# Patient Record
Sex: Female | Born: 1935 | Race: White | Hispanic: No | Marital: Single | Smoking: Never smoker
Health system: Southern US, Community
[De-identification: ages and names within clinical notes are randomized; demographics above are authoritative.]

## PROBLEM LIST (undated history)

## (undated) DIAGNOSIS — F0280 Dementia in other diseases classified elsewhere without behavioral disturbance: Secondary | ICD-10-CM

## (undated) DIAGNOSIS — F329 Major depressive disorder, single episode, unspecified: Secondary | ICD-10-CM

## (undated) DIAGNOSIS — G629 Polyneuropathy, unspecified: Secondary | ICD-10-CM

## (undated) DIAGNOSIS — F32A Depression, unspecified: Secondary | ICD-10-CM

## (undated) DIAGNOSIS — A048 Other specified bacterial intestinal infections: Secondary | ICD-10-CM

## (undated) DIAGNOSIS — I739 Peripheral vascular disease, unspecified: Secondary | ICD-10-CM

## (undated) DIAGNOSIS — G309 Alzheimer's disease, unspecified: Secondary | ICD-10-CM

## (undated) DIAGNOSIS — Z950 Presence of cardiac pacemaker: Secondary | ICD-10-CM

## (undated) DIAGNOSIS — I10 Essential (primary) hypertension: Secondary | ICD-10-CM

## (undated) DIAGNOSIS — E8809 Other disorders of plasma-protein metabolism, not elsewhere classified: Secondary | ICD-10-CM

## (undated) DIAGNOSIS — K279 Peptic ulcer, site unspecified, unspecified as acute or chronic, without hemorrhage or perforation: Secondary | ICD-10-CM

## (undated) DIAGNOSIS — M1990 Unspecified osteoarthritis, unspecified site: Secondary | ICD-10-CM

## (undated) HISTORY — DX: Polyneuropathy, unspecified: G62.9

## (undated) HISTORY — PX: PACEMAKER PLACEMENT: SHX43

## (undated) HISTORY — DX: Other disorders of plasma-protein metabolism, not elsewhere classified: E88.09

## (undated) HISTORY — DX: Dementia in other diseases classified elsewhere, unspecified severity, without behavioral disturbance, psychotic disturbance, mood disturbance, and anxiety: F02.80

## (undated) HISTORY — DX: Other specified bacterial intestinal infections: A04.8

## (undated) HISTORY — DX: Peptic ulcer, site unspecified, unspecified as acute or chronic, without hemorrhage or perforation: K27.9

## (undated) HISTORY — DX: Other disorders of phosphorus metabolism: E83.39

## (undated) HISTORY — DX: Essential (primary) hypertension: I10

## (undated) HISTORY — DX: Alzheimer's disease, unspecified: G30.9

## (undated) HISTORY — DX: Unspecified osteoarthritis, unspecified site: M19.90

## (undated) HISTORY — PX: OTHER SURGICAL HISTORY: SHX169

---
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
## 2001-06-02 ENCOUNTER — Encounter: Payer: Self-pay | Admitting: Family Medicine

## 2001-06-02 ENCOUNTER — Ambulatory Visit (HOSPITAL_COMMUNITY): Admission: RE | Admit: 2001-06-02 | Discharge: 2001-06-02 | Payer: Self-pay | Admitting: Family Medicine

## 2002-06-09 ENCOUNTER — Encounter: Payer: Self-pay | Admitting: Family Medicine

## 2002-06-09 ENCOUNTER — Ambulatory Visit (HOSPITAL_COMMUNITY): Admission: RE | Admit: 2002-06-09 | Discharge: 2002-06-09 | Payer: Self-pay | Admitting: Family Medicine

## 2003-06-13 ENCOUNTER — Ambulatory Visit (HOSPITAL_COMMUNITY): Admission: RE | Admit: 2003-06-13 | Discharge: 2003-06-13 | Payer: Self-pay | Admitting: Family Medicine

## 2003-06-13 ENCOUNTER — Encounter: Payer: Self-pay | Admitting: Family Medicine

## 2003-12-22 ENCOUNTER — Ambulatory Visit (HOSPITAL_COMMUNITY): Admission: RE | Admit: 2003-12-22 | Discharge: 2003-12-22 | Payer: Self-pay | Admitting: Family Medicine

## 2004-06-15 ENCOUNTER — Ambulatory Visit (HOSPITAL_COMMUNITY): Admission: RE | Admit: 2004-06-15 | Discharge: 2004-06-15 | Payer: Self-pay | Admitting: Family Medicine

## 2007-09-30 ENCOUNTER — Ambulatory Visit (HOSPITAL_COMMUNITY): Admission: RE | Admit: 2007-09-30 | Discharge: 2007-09-30 | Payer: Self-pay | Admitting: Family Medicine

## 2007-12-17 ENCOUNTER — Inpatient Hospital Stay (HOSPITAL_COMMUNITY): Admission: AD | Admit: 2007-12-17 | Discharge: 2007-12-22 | Payer: Self-pay | Admitting: Cardiology

## 2007-12-17 ENCOUNTER — Ambulatory Visit: Payer: Self-pay | Admitting: Internal Medicine

## 2007-12-17 ENCOUNTER — Encounter: Payer: Self-pay | Admitting: Emergency Medicine

## 2007-12-31 ENCOUNTER — Ambulatory Visit: Payer: Self-pay

## 2008-04-13 ENCOUNTER — Ambulatory Visit: Payer: Self-pay | Admitting: Internal Medicine

## 2008-06-29 ENCOUNTER — Encounter (INDEPENDENT_AMBULATORY_CARE_PROVIDER_SITE_OTHER): Payer: Self-pay | Admitting: *Deleted

## 2008-06-29 LAB — CONVERTED CEMR LAB
ALT: 8 units/L
BUN: 16 mg/dL
CO2: 25 meq/L
Calcium: 9.7 mg/dL
Chloride: 104 meq/L
Creatinine, Ser: 0.82 mg/dL
Glucose, Bld: 128 mg/dL

## 2009-01-11 ENCOUNTER — Ambulatory Visit: Payer: Self-pay | Admitting: Internal Medicine

## 2009-01-12 ENCOUNTER — Encounter: Payer: Self-pay | Admitting: Internal Medicine

## 2009-03-14 ENCOUNTER — Ambulatory Visit: Payer: Self-pay | Admitting: Internal Medicine

## 2009-06-14 ENCOUNTER — Ambulatory Visit: Payer: Self-pay | Admitting: Internal Medicine

## 2009-06-19 ENCOUNTER — Encounter: Payer: Self-pay | Admitting: Internal Medicine

## 2009-08-16 DIAGNOSIS — I2510 Atherosclerotic heart disease of native coronary artery without angina pectoris: Secondary | ICD-10-CM | POA: Insufficient documentation

## 2009-08-16 DIAGNOSIS — M109 Gout, unspecified: Secondary | ICD-10-CM

## 2009-08-16 DIAGNOSIS — D696 Thrombocytopenia, unspecified: Secondary | ICD-10-CM | POA: Insufficient documentation

## 2009-08-16 DIAGNOSIS — I459 Conduction disorder, unspecified: Secondary | ICD-10-CM | POA: Insufficient documentation

## 2009-08-16 DIAGNOSIS — E785 Hyperlipidemia, unspecified: Secondary | ICD-10-CM

## 2009-08-16 DIAGNOSIS — I10 Essential (primary) hypertension: Secondary | ICD-10-CM | POA: Insufficient documentation

## 2009-09-13 ENCOUNTER — Ambulatory Visit: Payer: Self-pay | Admitting: Internal Medicine

## 2009-09-21 ENCOUNTER — Encounter: Payer: Self-pay | Admitting: Internal Medicine

## 2010-01-04 ENCOUNTER — Encounter (INDEPENDENT_AMBULATORY_CARE_PROVIDER_SITE_OTHER): Payer: Self-pay | Admitting: *Deleted

## 2010-01-10 ENCOUNTER — Ambulatory Visit: Payer: Self-pay | Admitting: Internal Medicine

## 2010-01-10 DIAGNOSIS — Z950 Presence of cardiac pacemaker: Secondary | ICD-10-CM | POA: Insufficient documentation

## 2010-04-09 ENCOUNTER — Ambulatory Visit: Payer: Self-pay | Admitting: Internal Medicine

## 2010-04-26 ENCOUNTER — Encounter: Payer: Self-pay | Admitting: Internal Medicine

## 2010-07-12 ENCOUNTER — Ambulatory Visit: Payer: Self-pay | Admitting: Internal Medicine

## 2010-07-17 ENCOUNTER — Encounter: Payer: Self-pay | Admitting: Internal Medicine

## 2010-10-18 ENCOUNTER — Ambulatory Visit: Payer: Self-pay | Admitting: Internal Medicine

## 2010-11-15 ENCOUNTER — Encounter (INDEPENDENT_AMBULATORY_CARE_PROVIDER_SITE_OTHER): Payer: Self-pay | Admitting: *Deleted

## 2011-01-14 ENCOUNTER — Inpatient Hospital Stay (HOSPITAL_COMMUNITY)
Admission: EM | Admit: 2011-01-14 | Discharge: 2011-01-22 | DRG: 327 | Disposition: A | Discharge status: Skilled Nursing Facility | Payer: MEDICARE | Attending: General Surgery | Admitting: General Surgery

## 2011-01-14 DIAGNOSIS — I10 Essential (primary) hypertension: Secondary | ICD-10-CM | POA: Diagnosis present

## 2011-01-14 DIAGNOSIS — E785 Hyperlipidemia, unspecified: Secondary | ICD-10-CM | POA: Diagnosis present

## 2011-01-14 DIAGNOSIS — A048 Other specified bacterial intestinal infections: Secondary | ICD-10-CM | POA: Diagnosis present

## 2011-01-14 DIAGNOSIS — K255 Chronic or unspecified gastric ulcer with perforation: Principal | ICD-10-CM | POA: Diagnosis present

## 2011-01-14 DIAGNOSIS — E46 Unspecified protein-calorie malnutrition: Secondary | ICD-10-CM | POA: Diagnosis not present

## 2011-01-14 DIAGNOSIS — Z950 Presence of cardiac pacemaker: Secondary | ICD-10-CM

## 2011-01-14 DIAGNOSIS — F0390 Unspecified dementia without behavioral disturbance: Secondary | ICD-10-CM | POA: Diagnosis present

## 2011-01-14 DIAGNOSIS — K279 Peptic ulcer, site unspecified, unspecified as acute or chronic, without hemorrhage or perforation: Secondary | ICD-10-CM

## 2011-01-14 DIAGNOSIS — E8809 Other disorders of plasma-protein metabolism, not elsewhere classified: Secondary | ICD-10-CM | POA: Diagnosis not present

## 2011-01-14 HISTORY — DX: Peptic ulcer, site unspecified, unspecified as acute or chronic, without hemorrhage or perforation: K27.9

## 2011-01-14 LAB — URINALYSIS, ROUTINE W REFLEX MICROSCOPIC
Nitrite: NEGATIVE
Specific Gravity, Urine: >1.03 — ABNORMAL HIGH (ref 1.005–1.030)
Urobilinogen, UA: 1 mg/dL (ref 0.0–1.0)

## 2011-01-14 LAB — CBC
MCV: 89.4 fL (ref 78.0–100.0)
Platelets: 198 10*3/uL (ref 150–400)
RBC: 4.45 MIL/uL (ref 3.87–5.11)
WBC: 14.7 10*3/uL — ABNORMAL HIGH (ref 4.0–10.5)

## 2011-01-14 LAB — COMPREHENSIVE METABOLIC PANEL
ALT: 17 U/L (ref 0–35)
AST: 30 U/L (ref 0–37)
Albumin: 3.7 g/dL (ref 3.5–5.2)
CO2: 23 mEq/L (ref 19–32)
Calcium: 9.7 mg/dL (ref 8.4–10.5)
GFR calc non Af Amer: 47 mL/min — ABNORMAL LOW (ref >60)
Sodium: 139 mEq/L (ref 135–145)
Total Protein: 6.8 g/dL (ref 6.0–8.3)

## 2011-01-14 LAB — DIFFERENTIAL
Eosinophils Absolute: 0 10*3/uL (ref 0.0–0.7)
Lymphocytes Relative: 12 % (ref 12–46)
Lymphs Abs: 1.8 10*3/uL (ref 0.7–4.0)
Neutrophils Relative %: 84 % — ABNORMAL HIGH (ref 43–77)

## 2011-01-14 LAB — URINE MICROSCOPIC-ADD ON

## 2011-01-14 LAB — TROPONIN I

## 2011-01-14 LAB — CK TOTAL AND CKMB (NOT AT ARMC)

## 2011-01-15 NOTE — Assessment & Plan Note (Signed)
Summary: 1 yr/fu per checkout on 01/11/09/tg   Visit Type:  Follow-up Primary Provider:  mcinnis   History of Present Illness: [name] returns today for followup.  She is a very pleasant elderly woman with symptomatic bradycardia and pause-dependent polymorphic VT and complete heart block.  She is status post pacemaker insertion today.She returns today for followup.  She denies chest pain.  She notes that her appetite has been slightly decreased, otherwise she had been stable. She has lost almost 10 lbs.  She has been concerned about her cholesterol and has stayed away from high calorie foods.  Current Medications (verified): 1)  Cartia Xt 120 Mg Xr24h-Cap (Diltiazem Hcl Coated Beads) .... Take 1 Tab Daily 2)  Simvastatin 40 Mg Tabs (Simvastatin) .... Take 1 Tab Daily 3)  Lisinopril 10 Mg Tabs (Lisinopril) .... Ake 1 Tab Daily 4)  Metoprolol Tartrate 25 Mg Tabs (Metoprolol Tartrate) .... Take 1 Tab Two Times A Day 5)  Bayer Aspirin 325 Mg Tabs (Aspirin) .... Take 1 Tab Daily  Allergies (verified): No Known Drug Allergies  Past History:  Past Medical History: Last updated: 08/16/2009 Current Problems:  HEART BLOCK (ICD-426.9) CAD (ICD-414.00) THROMBOCYTOPENIA (ICD-287.5) GOUT, UNSPECIFIED (ICD-274.9) HYPERLIPIDEMIA (ICD-272.4) HYPERTENSION (ICD-401.9)  Past Surgical History: Last updated: 08/16/2009 cath 12/17/2007 placement of dual chamber pacemaker a medtronic versa vedro 1 pulse generator in the left subclavian area by Dr.Steven Graciela Husbands.  Review of Systems  The patient denies chest pain, syncope, dyspnea on exertion, and peripheral edema.    Vital Signs:  Patient profile:   77 year old female Weight:      122 pounds Pulse rate:   79 / minute BP sitting:   140 / 75  (right arm)  Vitals Entered By: Dreama Saa, CNA (January 10, 2010 2:54 PM)  Physical Exam  General:  Elderly, well developed, well nourished, in no acute distress.  HEENT: normal Neck: supple.  No JVD. Carotids 2+ bilaterally no bruits Cor: RRR no rubs, gallops or murmur Lungs: CTA. Well healed PPM incision. Ab: soft, nontender. nondistended. No HSM. Good bowel sounds Ext: warm. no cyanosis, clubbing or edema Neuro: alert and oriented. Grossly nonfocal. affect pleasant    PPM Specifications Following MD:  Lewayne Bunting, MD     PPM Vendor:  Medtronic     PPM Model Number:  [id number]     PPM Serial Number:  [id number] PPM DOI:  12/18/2007     PPM Implanting MD:  Lewayne Bunting, MD  Lead 1    Location: [facility]     DOI: 12/18/2007     Model #: 0932     Serial #: [id number]     Status: active Lead 2    Location: [facility]     DOI: 12/18/2007     Model #: 4270     Serial #: [id number]     Status: active  Magnet Response Rate:  BOL 85 ERI 65  Indications:  SECOND DEGREE HEART BLOCK    PPM Follow Up Remote Check?  No Battery Voltage:  2.78 V     Battery Est. Longevity:  6 years     Pacer Dependent:  Yes       PPM Device Measurements Atrium  Amplitude: 1.0 mV, Impedance: 427 ohms, Threshold: 0.5 V at 0.4 msec Right Ventricle  Impedance: 518 ohms, Threshold: 0.625 V at 0.4 msec  Episodes MS Episodes:  12     Percent Mode Switch:  <0.1%     Coumadin:  No Ventricular High Rate:  0     Atrial Pacing:  93.3%     Ventricular Pacing:  100%DDDR  Parameters Mode:  70     Lower Rate Limit:  110     Upper Rate Limit:  150 Paced AV Delay:  120     Next Remote Date:  04/11/2010     Next Cardiology Appt Due:  12/16/2010 Tech Comments:  No parameter changes.  Device function normal.  [facility] transmissions every 3 months.  ROV 1 year Dr. Ladona Ridgel in [place]. Altha Harm, LPN  January 10, 2010 3:20 PM  MD Comments:  Agree with above.  Impression & Recommendations:  Problem # 1:  HEART BLOCK (ICD-426.9) S/P PPM.  No symptoms. Her updated medication list for this problem includes:    Cartia Xt 120 Mg Xr24h-cap (Diltiazem hcl coated beads) .Marland Kitchen... Take 1 tab daily    Lisinopril 10 Mg Tabs (Lisinopril) .Marland Kitchen... Ake  1 tab daily    Metoprolol Tartrate 25 Mg Tabs (Metoprolol tartrate) .Marland Kitchen... Take 1 tab two times a day    Bayer Aspirin 325 Mg Tabs (Aspirin) .Marland Kitchen... Take 1 tab daily  Problem # 2:  HYPERTENSION (ICD-401.9) Her blood pressure has been well controlled.  Will continue a low sodium diet. Her updated medication list for this problem includes:    Cartia Xt 120 Mg Xr24h-cap (Diltiazem hcl coated beads) .Marland Kitchen... Take 1 tab daily    Lisinopril 10 Mg Tabs (Lisinopril) .Marland Kitchen... Ake 1 tab daily    Metoprolol Tartrate 25 Mg Tabs (Metoprolol tartrate) .Marland Kitchen... Take 1 tab two times a day    Bayer Aspirin 325 Mg Tabs (Aspirin) .Marland Kitchen... Take 1 tab daily  Problem # 3:  CARDIAC PACEMAKER IN SITU (ICD-V45.01) Her device is working normally.Will recheck in several months.  Problem # 4:  HYPERLIPIDEMIA (ICD-272.4) In light of her recent weight loss, I have encouraged her to increase her caloric intake even if she requires higher calorie foods. Her updated medication list for this problem includes:    Simvastatin 40 Mg Tabs (Simvastatin) .Marland Kitchen... Take 1 tab daily

## 2011-01-15 NOTE — Letter (Signed)
Summary: Remote Device Check  [facility], Main Office  [address]   [place], [place] [address]   Phone: [phone]  Fax: [phone]     July 17, 2010 MRN: [id number]   [name] [address] [place], [place]  [address]   Dear Ms. Hendrie,   Your remote transmission was recieved and reviewed by your physician.  All diagnostics were within normal limits for you.  __X___Your next transmission is scheduled for:  10-18-2010.  Please transmit at any time this day.  If you have a wireless device your transmission will be sent automatically.    Sincerely,  Vella Kohler

## 2011-01-15 NOTE — Letter (Signed)
Summary: Remote Device Check  [facility], Main Office  [address]   [place], [place] [address]   Phone: [phone]  Fax: [phone]     November 15, 2010 MRN: [id number]   FRANCOISE CHOJNOWSKI [address] [place], [place]  [address]   Dear Ms. Brocksmith,   Your remote transmission was recieved and reviewed by your physician.  All diagnostics were within normal limits for you.  _____Your next transmission is scheduled for:                       .  Please transmit at any time this day.  If you have a wireless device your transmission will be sent automatically.  ___X___Your next office visit is scheduled for: January 2012 with Dr. Ladona Ridgel.                             . Please call our office to schedule an appointment.    Sincerely,  Altha Harm, LPN

## 2011-01-15 NOTE — Op Note (Signed)
  [name], [name]              ACCOUNT NO.:  [id number]  MEDICAL RECORD NO.:  [id number]          PATIENT TYPE:  INP  LOCATION:  A213                          FACILITY:  [facility]  PHYSICIAN:  Dalia Heading, M.D.  DATE OF BIRTH:  Nov 17, 1921  DATE OF PROCEDURE:  01/14/2011 DATE OF DISCHARGE:  01/14/2011                              OPERATIVE REPORT   PREOPERATIVE DIAGNOSIS:  Perforated viscus.  POSTOPERATIVE DIAGNOSES:  Perforated viscus, perforated peptic ulcer  PROCEDURE:  Exploratory laparotomy and gastrorrhaphy.  SURGEON:  Dalia Heading, M.D.  ANESTHESIA:  General endotracheal.  INDICATIONS:  The patient is an 77 year old white female who presented to the emergency room with a several-day history of worsening upper abdominal pain.  Chest x-ray with acute abdominal series was performed which revealed pneumoperitoneum.  The patient now comes to the operating room for an exploratory laparotomy for presumed perforated viscus.  Therisks and benefits of the procedure including bleeding, infection, and the possibility of colostomy were fully explained to the patient, gave informed consent.  PROCEDURE NOTE:  The patient was placed in the supine position.  After induction of general endotracheal anesthesia, the abdomen was prepped and draped in the usual sterile technique with DuraPrep.  Surgical site confirmation was performed.  Upper midline incision was made.  The peritoneal cavity was entered into without difficulty.  On inspection, minimal fluid was noted in the upper abdomen.  There was a large perforation anteriorly at the pylorus involving primarily the duodenum.  The gallbladder and surrounding adipose tissue was noted to be adherent to this region, and this was freed away without difficulty.  The defect was approximately 1.5 to 2 cm in its greatest diameter.  It was reapproximated loosely using 3-0 silk interrupted sutures.  The falciform ligament which was very  floppy and close to this area was used for the plication.  It was secured over the perforation site using 3-0 silk sutures.  #10 flat Jackson-Pratt drain was placed into this region and brought through a separate stab wound inferior to the incision line.  It was secured to the skin level using a 3-0 nylon interrupted suture.  The upper abdomen was then copiously irrigated with normal saline.  The NG tube was noted be in appropriate position in the stomach.  The fascia was reapproximated using an 0 PDS running suture.  The subcutaneous layer was irrigated with normal saline, and the skin was closed using staples.  Betadine ointment dry sterile dressings were applied.  All tape and needle counts were correct at the end of the procedure. The patient was extubated in the operating room, went back to recovery room awake in stable condition.  COMPLICATIONS:  None.  SPECIMEN:  None.  BLOOD LOSS:  Minimal.     Dalia Heading, M.D.     MAJ/MEDQ  D:  01/15/2011  T:  01/15/2011  Job:  [id number]  cc:   Catalina Pizza, M.D. Fax: [phone]  Electronically Signed by Franky Macho M.D. on 01/15/2011 09:16:56 AM

## 2011-01-15 NOTE — Letter (Signed)
Summary: Remote Device Check  [facility], Main Office  [address]   [place], [place] [address]   Phone: [phone]  Fax: [phone]     Apr 26, 2010 MRN: [id number]   SHACARRA CHOE [address] [place], [place]  [address]   Dear Ms. Johanning,   Your remote transmission was recieved and reviewed by your physician.  All diagnostics were within normal limits for you.  __X___Your next transmission is scheduled for:    07-12-2010.  Please transmit at any time this day.  If you have a wireless device your transmission will be sent automatically.   Sincerely,  Vella Kohler

## 2011-01-15 NOTE — Cardiovascular Report (Signed)
Summary: Office Visit Remote   Office Visit Remote   Imported By: Roderic Ovens 07/19/2010 16:06:08  _____________________________________________________________________  External Attachment:    Type:   Image     Comment:   External Document

## 2011-01-15 NOTE — Cardiovascular Report (Signed)
Summary: Office Visit Remote   Office Visit Remote   Imported By: Roderic Ovens 04/30/2010 15:30:56  _____________________________________________________________________  External Attachment:    Type:   Image     Comment:   External Document

## 2011-01-15 NOTE — Miscellaneous (Signed)
Summary: LABS CMP,LIVER 06/29/2008  Clinical Lists Changes  Observations: Added new observation of CALCIUM: 9.7 mg/dL (04/54/0981 19:14) Added new observation of ALBUMIN: 3.8 g/dL (78/29/5621 30:86) Added new observation of PROTEIN, TOT: 6.5 g/dL (57/84/6962 95:28) Added new observation of SGPT (ALT): 8 units/L (06/29/2008 16:11) Added new observation of SGOT (AST): 18 units/L (06/29/2008 16:11) Added new observation of ALK PHOS: 43 units/L (06/29/2008 16:11) Added new observation of BILI DIRECT: 0.2 mg/dL (41/32/4401 02:72) Added new observation of CREATININE: 0.82 mg/dL (53/66/4403 47:42) Added new observation of BUN: 16 mg/dL (59/56/3875 64:33) Added new observation of BG RANDOM: 128 mg/dL (29/51/8841 66:06) Added new observation of CO2 PLSM/SER: 25 meq/L (06/29/2008 16:11) Added new observation of CL SERUM: 104 meq/L (06/29/2008 16:11) Added new observation of K SERUM: 4.1 meq/L (06/29/2008 16:11) Added new observation of NA: 142 meq/L (06/29/2008 16:11) Added new observation of LDL: 157 mg/dL (30/16/0109 32:35) Added new observation of HDL: 58 mg/dL (57/32/2025 42:70) Added new observation of TRIGLYC TOT: 170 mg/dL (62/37/6283 15:17) Added new observation of CHOLESTEROL: 249 mg/dL (61/60/7371 06:26)

## 2011-01-16 LAB — DIFFERENTIAL
Basophils Absolute: 0 10*3/uL (ref 0.0–0.1)
Basophils Relative: 0 % (ref 0–1)
Eosinophils Relative: 0 % (ref 0–5)
Lymphocytes Relative: 5 % — ABNORMAL LOW (ref 12–46)
Monocytes Absolute: 0.7 10*3/uL (ref 0.1–1.0)
Monocytes Relative: 5 % (ref 3–12)

## 2011-01-16 LAB — BASIC METABOLIC PANEL
BUN: 26 mg/dL — ABNORMAL HIGH (ref 6–23)
Calcium: 8.4 mg/dL (ref 8.4–10.5)
Creatinine, Ser: 0.87 mg/dL (ref 0.4–1.2)

## 2011-01-16 LAB — CBC
HCT: 37.9 % (ref 36.0–46.0)
MCH: 30.7 pg (ref 26.0–34.0)
MCHC: 34 g/dL (ref 30.0–36.0)
RDW: 13.8 % (ref 11.5–15.5)

## 2011-01-16 LAB — MAGNESIUM: Magnesium: 1.8 mg/dL (ref 1.5–2.5)

## 2011-01-16 LAB — ALBUMIN: Albumin: 2.5 g/dL — ABNORMAL LOW (ref 3.5–5.2)

## 2011-01-17 LAB — DIFFERENTIAL
Basophils Relative: 0 % (ref 0–1)
Eosinophils Absolute: 0 10*3/uL (ref 0.0–0.7)
Eosinophils Relative: 0 % (ref 0–5)
Lymphs Abs: 0.8 10*3/uL (ref 0.7–4.0)
Monocytes Relative: 5 % (ref 3–12)
Neutrophils Relative %: 87 % — ABNORMAL HIGH (ref 43–77)

## 2011-01-17 LAB — CBC
MCH: 31 pg (ref 26.0–34.0)
MCHC: 34.9 g/dL (ref 30.0–36.0)
MCV: 88.6 fL (ref 78.0–100.0)
Platelets: 173 10*3/uL (ref 150–400)
RBC: 3.78 MIL/uL — ABNORMAL LOW (ref 3.87–5.11)
RDW: 13.9 % (ref 11.5–15.5)

## 2011-01-17 LAB — ALBUMIN: Albumin: 1.9 g/dL — ABNORMAL LOW (ref 3.5–5.2)

## 2011-01-17 LAB — BASIC METABOLIC PANEL
BUN: 21 mg/dL (ref 6–23)
Calcium: 8 mg/dL — ABNORMAL LOW (ref 8.4–10.5)
Chloride: 106 mEq/L (ref 96–112)
Creatinine, Ser: 0.8 mg/dL (ref 0.4–1.2)
GFR calc non Af Amer: >60 mL/min (ref >60)

## 2011-01-17 LAB — MAGNESIUM: Magnesium: 1.7 mg/dL (ref 1.5–2.5)

## 2011-01-18 LAB — DIFFERENTIAL
Basophils Absolute: 0 10*3/uL (ref 0.0–0.1)
Basophils Relative: 0 % (ref 0–1)
Eosinophils Absolute: 0 10*3/uL (ref 0.0–0.7)
Eosinophils Relative: 0 % (ref 0–5)
Monocytes Absolute: 0.7 10*3/uL (ref 0.1–1.0)
Monocytes Relative: 6 % (ref 3–12)
Neutro Abs: 10 10*3/uL — ABNORMAL HIGH (ref 1.7–7.7)

## 2011-01-18 LAB — PHOSPHORUS: Phosphorus: 1 mg/dL — CL (ref 2.3–4.6)

## 2011-01-18 LAB — BASIC METABOLIC PANEL
CO2: 25 mEq/L (ref 19–32)
Calcium: 7.9 mg/dL — ABNORMAL LOW (ref 8.4–10.5)
Creatinine, Ser: 0.69 mg/dL (ref 0.4–1.2)
GFR calc Af Amer: >60 mL/min (ref >60)
GFR calc non Af Amer: >60 mL/min (ref >60)
Sodium: 134 mEq/L — ABNORMAL LOW (ref 135–145)

## 2011-01-18 LAB — CBC
HCT: 32.2 % — ABNORMAL LOW (ref 36.0–46.0)
MCHC: 34.8 g/dL (ref 30.0–36.0)
Platelets: 167 10*3/uL (ref 150–400)
RDW: 13.7 % (ref 11.5–15.5)
WBC: 12.2 10*3/uL — ABNORMAL HIGH (ref 4.0–10.5)

## 2011-01-19 LAB — BASIC METABOLIC PANEL
BUN: 9 mg/dL (ref 6–23)
Calcium: 8.2 mg/dL — ABNORMAL LOW (ref 8.4–10.5)
Creatinine, Ser: 0.71 mg/dL (ref 0.4–1.2)
GFR calc non Af Amer: >60 mL/min (ref >60)
Glucose, Bld: 142 mg/dL — ABNORMAL HIGH (ref 70–99)

## 2011-01-19 LAB — CBC
MCH: 30.7 pg (ref 26.0–34.0)
MCV: 88.4 fL (ref 78.0–100.0)
Platelets: 171 10*3/uL (ref 150–400)
RDW: 13.8 % (ref 11.5–15.5)
WBC: 12.6 10*3/uL — ABNORMAL HIGH (ref 4.0–10.5)

## 2011-01-19 LAB — DIFFERENTIAL
Basophils Relative: 0 % (ref 0–1)
Eosinophils Absolute: 0.1 10*3/uL (ref 0.0–0.7)
Eosinophils Relative: 1 % (ref 0–5)
Lymphs Abs: 1.1 10*3/uL (ref 0.7–4.0)
Monocytes Relative: 10 % (ref 3–12)
Neutrophils Relative %: 80 % — ABNORMAL HIGH (ref 43–77)

## 2011-01-19 LAB — ALBUMIN: Albumin: 2 g/dL — ABNORMAL LOW (ref 3.5–5.2)

## 2011-01-19 LAB — MAGNESIUM: Magnesium: 1.5 mg/dL (ref 1.5–2.5)

## 2011-01-20 LAB — CBC
Hemoglobin: 11 g/dL — ABNORMAL LOW (ref 12.0–15.0)
MCH: 30.6 pg (ref 26.0–34.0)
MCHC: 35.1 g/dL (ref 30.0–36.0)
Platelets: 173 10*3/uL (ref 150–400)
RDW: 13.7 % (ref 11.5–15.5)

## 2011-01-20 LAB — BASIC METABOLIC PANEL
BUN: 6 mg/dL (ref 6–23)
Calcium: 8.2 mg/dL — ABNORMAL LOW (ref 8.4–10.5)
Chloride: 97 mEq/L (ref 96–112)
Creatinine, Ser: 0.6 mg/dL (ref 0.4–1.2)

## 2011-01-20 LAB — DIFFERENTIAL
Basophils Relative: 0 % (ref 0–1)
Eosinophils Absolute: 0.1 10*3/uL (ref 0.0–0.7)
Eosinophils Relative: 0 % (ref 0–5)
Monocytes Absolute: 1.7 10*3/uL — ABNORMAL HIGH (ref 0.1–1.0)
Monocytes Relative: 11 % (ref 3–12)

## 2011-01-20 LAB — ALBUMIN: Albumin: 2 g/dL — ABNORMAL LOW (ref 3.5–5.2)

## 2011-01-20 LAB — MAGNESIUM: Magnesium: 1.4 mg/dL — ABNORMAL LOW (ref 1.5–2.5)

## 2011-01-21 LAB — CBC
HCT: 30.6 % — ABNORMAL LOW (ref 36.0–46.0)
MCHC: 34.6 g/dL (ref 30.0–36.0)
MCV: 86.9 fL (ref 78.0–100.0)
RDW: 13.7 % (ref 11.5–15.5)

## 2011-01-21 LAB — DIFFERENTIAL
Basophils Absolute: 0 10*3/uL (ref 0.0–0.1)
Eosinophils Relative: 0 % (ref 0–5)
Lymphocytes Relative: 8 % — ABNORMAL LOW (ref 12–46)
Monocytes Absolute: 1.7 10*3/uL — ABNORMAL HIGH (ref 0.1–1.0)

## 2011-01-21 LAB — BASIC METABOLIC PANEL
CO2: 29 mEq/L (ref 19–32)
Calcium: 8.2 mg/dL — ABNORMAL LOW (ref 8.4–10.5)
Creatinine, Ser: 0.64 mg/dL (ref 0.4–1.2)
GFR calc Af Amer: >60 mL/min (ref >60)
Glucose, Bld: 118 mg/dL — ABNORMAL HIGH (ref 70–99)

## 2011-01-22 HISTORY — PX: INSERT / REPLACE / REMOVE PACEMAKER: SUR710

## 2011-01-25 NOTE — Discharge Summary (Signed)
  NAME:  [name], [name] NO.:  [id number]  MEDICAL RECORD NO.:  [id number]           PATIENT TYPE:  I  LOCATION:                                 FACILITY:  PHYSICIAN:  Dalia Heading, M.D.  DATE OF BIRTH:  February 17, 1921  DATE OF ADMISSION:  01/14/2011 DATE OF DISCHARGE:  02/06/2012LH                              DISCHARGE SUMMARY   HOSPITAL COURSE SUMMARY:  The patient is an 77 year old white female who presented to the emergency room with worsening abdominal pain.  An abdominal film and chest x-ray were performed which revealed pneumoperitoneum.  Surgery consultation was obtained and the patient was taken urgently to the operating room and underwent exploratory laparotomy with a gastrorrhaphy.  She had a pyloric perforation.  She tolerated the procedure remarkably well.  The postoperative course was remarkable for electrolyte abnormalities including a low phosphorus level, hypoalbuminemia.  Her urine output was low initially and had to be supplemented with boluses of normal saline.  She did test positive for H. pylori.  Once her urine output improved, her Foley was removed and her diet was advanced without significant difficulty.  She does require a significant amount of encouragement and nursing help to eat. This is secondary to her age.  She was obstipated and this was resolved with disimpaction and enemas.  She is being discharged to [facility] in fair and stable condition.  DISCHARGE INSTRUCTIONS:  The patient is to have her abdominal staples removed on January 28, 2011.  DISCHARGE MEDICATIONS: 1. Amoxicillin 1 gram p.o. b.i.d. x10 days. 2. Biaxin 500 mg p.o. b.i.d. x10 days. 3. Prevacid 30 mg p.o. b.i.d. 4. Diltiazem 120 mg p.o. daily. 5. Celexa 10 mg p.o. daily. 6. Diclofenac 50 mg p.o. b.i.d. 7. Lopressor 25 mg p.o. b.i.d. 8. Namenda 10 mg p.o. b.i.d. 9. Tramadol 25 mg p.o. q.6 h. p.r.n. 10.Zocor 40 mg p.o. at bedtime.  PRINCIPAL  DIAGNOSES: 1. Peptic ulcer. 2. Helicobacter pylori positive. 3. Hypertension. 4. Status post pacemaker placement in the past. 5. Hypophosphatemia, resolved. 6. Hypoalbuminemia.  PRINCIPAL PROCEDURE:  Exploratory laparotomy, gastrorrhaphy on January 14, 2011.     Dalia Heading, M.D.     MAJ/MEDQ  D:  01/21/2011  T:  01/21/2011  Job:  [id number]  cc:   Catalina Pizza, M.D. Fax: [phone]  Electronically Signed by Franky Macho M.D. on 01/25/2011 01:24:35 PM

## 2011-02-06 ENCOUNTER — Encounter: Payer: Self-pay | Admitting: Internal Medicine

## 2011-02-06 ENCOUNTER — Encounter (INDEPENDENT_AMBULATORY_CARE_PROVIDER_SITE_OTHER): Payer: MEDICARE | Admitting: Internal Medicine

## 2011-02-06 DIAGNOSIS — R609 Edema, unspecified: Secondary | ICD-10-CM | POA: Insufficient documentation

## 2011-02-06 DIAGNOSIS — I442 Atrioventricular block, complete: Secondary | ICD-10-CM

## 2011-02-06 DIAGNOSIS — I4891 Unspecified atrial fibrillation: Secondary | ICD-10-CM

## 2011-02-06 DIAGNOSIS — I5033 Acute on chronic diastolic (congestive) heart failure: Secondary | ICD-10-CM

## 2011-02-07 ENCOUNTER — Encounter: Payer: Self-pay | Admitting: Internal Medicine

## 2011-02-11 ENCOUNTER — Encounter: Payer: Self-pay | Admitting: Internal Medicine

## 2011-02-12 NOTE — Assessment & Plan Note (Signed)
Summary: 1 yr f/u per checkout on 01/10/10/tg/hm   Visit Type:  Follow-up Primary Provider:  Dr.[facility]  CC:  edema in feet and legs.  History of Present Illness: [name] returns today for followup.  She is a very pleasant elderly woman with symptomatic bradycardia and pause-dependent polymorphic VT and complete heart block.  She is status post pacemaker insertion. She returns today for followup.  She denies chest pain.  She has been in the hospital with a perforated ulcer requiring emergent surgery. She has recovered except that her memory is bad and her legs have been swollen. No other complaints. She had initially been given 20 mg a day of lasix but her daughter does not think that this has helped much.  Current Medications (verified): 1)  Simvastatin 10 Mg Tabs (Simvastatin) .... Take 1 Tab Dialy 2)  Metoprolol Tartrate 25 Mg Tabs (Metoprolol Tartrate) .... Take 1 Tab Two Times A Day 3)  Bayer Aspirin 325 Mg Tabs (Aspirin) .... Take 1 Tab Daily 4)  Diclofenac Sodium 50 Mg Tbec (Diclofenac Sodium) .... Take 1 Tab Two Times A Day 5)  Prevacid 30 Mg Cpdr (Lansoprazole) .... Take 1 Tab Two Times A Day 6)  Biaxin 500 Mg Tabs (Clarithromycin) .... Take 1 Tab Two Times A Day 7)  Tramadol Hcl 50 Mg Tabs (Tramadol Hcl) .... 1/2 Tab As Needed 8)  Namenda 10 Mg Tabs (Memantine Hcl) .... Take 1 Tab Two Times A Day 9)  Furosemide 40 Mg Tabs (Furosemide) .... Take One Tablet By Mouth Daily. 10)  Potassium Chloride 20 Meq Pack (Potassium Chloride) .... Take 2 Tablets By Mouth Daily 11)  Senokot 8.6 Mg Tabs (Sennosides) .... Take 2 Tabs As Needed  Allergies (verified): No Known Drug Allergies  Comments:  Nurse/Medical Assistant: patient is a resident of [facility]  Past History:  Past Medical History: Last updated: 08/16/2009 Current Problems:  HEART BLOCK (ICD-426.9) CAD (ICD-414.00) THROMBOCYTOPENIA (ICD-287.5) GOUT, UNSPECIFIED (ICD-274.9) HYPERLIPIDEMIA (ICD-272.4) HYPERTENSION  (ICD-401.9)  Past Surgical History: Last updated: 08/16/2009 cath 12/17/2007 placement of dual chamber pacemaker a medtronic versa vedro 1 pulse generator in the left subclavian area by Dr.Steven Graciela Husbands.  Review of Systems       The patient complains of peripheral edema.  The patient denies chest pain, syncope, and dyspnea on exertion.    Vital Signs:  Patient profile:   77 year old female Height:      65 inches Weight:      128 pounds BMI:     21.38 Pulse rate:   74 / minute BP sitting:   97 / 58  (left arm)  Vitals Entered By: Dreama Saa, CNA (February 06, 2011 9:38 AM)  Physical Exam  General:  Elderly, well developed, well nourished, in no acute distress.  HEENT: normal Neck: supple. No JVD. Carotids 2+ bilaterally no bruits Cor: RRR no rubs, gallops or murmur Lungs: CTA. Well healed PPM incision. Ab: soft, nontender. with minimal distention. No HSM. Good bowel sounds Ext: warm. no cyanosis, clubbing. 3+ edema. Neuro: alert and oriented. Grossly nonfocal. affect pleasant    PPM Specifications Following MD:  Lewayne Bunting, MD     PPM Vendor:  [employer]     PPM Model Number:  [id number]     PPM Serial Number:  [id number] PPM DOI:  12/18/2007     PPM Implanting MD:  Lewayne Bunting, MD  Lead 1    Location: [facility]     DOI: 12/18/2007     Model #: 8469  Serial #: [id number]     Status: active Lead 2    Location: [facility]     DOI: 12/18/2007     Model #: 1478     Serial #: [id number]     Status: active  Magnet Response Rate:  BOL 85 ERI 65  Indications:  SECOND DEGREE HEART BLOCK    PPM Follow Up Pacer Dependent:  Yes      Episodes Coumadin:  No  Parameters Mode:  70     Lower Rate Limit:  110     Upper Rate Limit:  150 Paced AV Delay:  120     MD Comments:  Normal device function.  Impression & Recommendations:  Problem # 1:  CARDIAC PACEMAKER IN SITU (ICD-V45.01) Her device is working normally. Will recheck in several months.  Problem # 2:  HYPERTENSION  (ICD-401.9) Her blood pressure is low today and I have asked her to stop her cartia. The following medications were removed from the medication list:    Cartia Xt 120 Mg Xr24h-cap (Diltiazem hcl coated beads) .Marland Kitchen... Take 1 tab daily    Lisinopril 10 Mg Tabs (Lisinopril) .Marland Kitchen... Ake 1 tab daily Her updated medication list for this problem includes:    Metoprolol Tartrate 25 Mg Tabs (Metoprolol tartrate) .Marland Kitchen... Take 1 tab two times a day    Bayer Aspirin 325 Mg Tabs (Aspirin) .Marland Kitchen... Take 1 tab daily    Furosemide 40 Mg Tabs (Furosemide) .Marland Kitchen... Take one tablet by mouth daily.  Problem # 3:  EDEMA (ICD-782.3) I have asked her to maintain a low sodium diet, increase her lasix and potassium and will check labs today.  Other Orders: T-Basic Metabolic Panel [phone])  Patient Instructions: 1)  Your physician recommends that you schedule a follow-up appointment in: 1 YEAR DR Ladona Ridgel, 6 MONTH PAULA 2)  Your physician recommends that you return for lab work in: TODAY STAT 3)  Your physician has recommended you make the following change in your medication: INCREASE LASIX TO 40MG  DAILY AND KCL TO DAILY, STOP DILTIAZEM

## 2011-02-20 ENCOUNTER — Other Ambulatory Visit (HOSPITAL_COMMUNITY): Payer: Self-pay | Admitting: Internal Medicine

## 2011-02-26 ENCOUNTER — Other Ambulatory Visit (HOSPITAL_COMMUNITY): Payer: Self-pay | Admitting: Internal Medicine

## 2011-02-26 ENCOUNTER — Ambulatory Visit (HOSPITAL_COMMUNITY)
Admission: RE | Admit: 2011-02-26 | Discharge: 2011-02-26 | Disposition: A | Discharge status: Home / Self Care | Payer: Medicare Other | Source: Ambulatory Visit | Attending: Internal Medicine | Admitting: Internal Medicine

## 2011-02-26 DIAGNOSIS — IMO0001 Reserved for inherently not codable concepts without codable children: Secondary | ICD-10-CM | POA: Insufficient documentation

## 2011-02-26 DIAGNOSIS — R1310 Dysphagia, unspecified: Secondary | ICD-10-CM | POA: Insufficient documentation

## 2011-04-17 ENCOUNTER — Ambulatory Visit (INDEPENDENT_AMBULATORY_CARE_PROVIDER_SITE_OTHER): Payer: Medicare Other | Admitting: Urgent Care

## 2011-04-17 ENCOUNTER — Encounter: Payer: Self-pay | Admitting: Urgent Care

## 2011-04-17 VITALS — BP 91/49 | HR 73 | Temp 98.0°F | Ht 61.0 in | Wt 98.3 lb

## 2011-04-17 DIAGNOSIS — K625 Hemorrhage of anus and rectum: Secondary | ICD-10-CM

## 2011-04-17 DIAGNOSIS — R1310 Dysphagia, unspecified: Secondary | ICD-10-CM

## 2011-04-17 DIAGNOSIS — K229 Disease of esophagus, unspecified: Secondary | ICD-10-CM

## 2011-04-17 NOTE — Assessment & Plan Note (Signed)
See abnormal esopahgus

## 2011-04-17 NOTE — Progress Notes (Signed)
Referring Provider: Avon Gully, MD Primary Care Physician:  Avon Gully, MD Primary Gastroenterologist:  Dr. Darrick Penna  Chief Complaint  Patient presents with  . Difficulty swallowing    abnl esophagus     HPI:  [name] is a 77 y.o. female here as a referral from Dr. Felecia Shelling for evaluation abnormal esophagus "curved".  She presents w/ her sister who does a lot of the talking during the exam.  Pt has had dysphagia, feels like food will not go down especially when she is being fed by caregivers.  Has had evaluation by Speech Pathologist at [facility] & believes she ha xray, but we do not have results.  Eats very little.  C/o nausea & anorexia without any vomiting.  Occasional regurgitation.  Has had sm-mod amts bright red blood in Depends.  Some upper abd pain that is intermittent 7/10 at worst, lasts a few hrs.  Daughter notes 30# weight loss in past 3 months since perforated pylorus secondary to PUD s/p exploratory lap w/ gastrorrhaphy.  Has been treated for h pylori.  On mechanical soft diet & uses nutritional supplement drink between meals.  On diclofenac for possible arthritis.  Also on prevacid.  Uses senokot as needed for constipation, along with stool softeners.    Past Medical History  Diagnosis Date  . Alzheimer disease   . PUD (peptic ulcer disease) 01/14/2011    pyloric perforation, Dr . Lovell Sheehan  . H. pylori infection   . HTN (hypertension)   . Hypophosphatemia   . Hypoalbuminemia   . Neuropathy   . Arthritis   . Osteoporosis     Past Surgical History  Procedure Date  . Appendectomy 1947  . Perforated viscus 1/30/112    gastrorrhaphy-> Dr Lovell Sheehan  . Pacemaker placement     Current Outpatient Prescriptions  Medication Sig Dispense Refill  . citalopram (CELEXA) 10 MG tablet       . diclofenac (VOLTAREN) 50 MG EC tablet       . docusate sodium (COLACE) 100 MG capsule Take 100 mg by mouth 2 (two) times daily.        . furosemide (LASIX) 40 MG tablet       . KLOR-CON  M20 20 MEQ tablet       . lansoprazole (PREVACID) 30 MG capsule       . metoprolol tartrate (LOPRESSOR) 25 MG tablet       . NAMENDA 10 MG tablet       . senna (SENOKOT) 8.6 MG tablet Take 1 tablet by mouth daily.        . simvastatin (ZOCOR) 10 MG tablet       . traMADol (ULTRAM) 50 MG tablet         Allergies as of 04/17/2011  . (No Known Allergies)    Family History:    Problem Relation Age of Onset  . Rectal cancer Sister 47  . Coronary artery disease Sister   . Brain cancer Sister   . Stroke Sister   . Alzheimer's disease Sister   . Stroke Mother   . Stroke Father   . Alzheimer's disease Mother     History   Social History  . Marital Status: Widowed    Spouse Name: N/A    Number of Children: 0  . Years of Education: N/A   Occupational History  . retired     Designer, fashion/clothing   Social History Main Topics  . Smoking status: Never Smoker   . Smokeless tobacco: Not  on file  . Alcohol Use: No  . Drug Use: No  . Sexually Active: No   Other Topics Concern  . Not on file   Social History Narrative   [facility] [place], [phone] states she is POA/HCPOA    Review of Systems: Gen: Denies any fever, chills, sweats, anorexia, fatigue, weakness, malaise, and sleep disorder CV: Denies chest pain, angina, palpitations, syncope, orthopnea, PND, peripheral edema, and claudication. Resp: Denies dyspnea at rest, dyspnea with exercise, cough, sputum, wheezing, coughing up blood, and pleurisy. GI: Denies vomiting blood, jaundice.  Has fecal incontinence.   GU : Denies urinary burning, blood in urine, urinary frequency, urinary hesitancy, nocturnal urination, and urinary incontinence. MS: c/o occ back pain.  Neuropathy bilat feet. Derm: Denies rash, itching, dry skin, hives, moles, warts, or unhealing ulcers.  Psych: Denies depression, anxiety, memory loss, suicidal ideation, hallucinations, paranoia, and confusion. Heme: Denies bruising, bleeding, and enlarged  lymph nodes.  Physical Exam: BP 91/49  Pulse 73  Temp(Src) 98 F (36.7 C) (Tympanic)  Ht 5\' 1"  (1.549 m)  Wt 98 lb 4.8 oz (44.589 kg)  BMI 18.57 kg/m2 General:   Alert,  Cooperative elderly female in wheelchair in NAD.  Head:  Normocephalic and atraumatic. Eyes:  Sclera clear, no icterus.   Conjunctiva pink. Ears:  Normal auditory acuity. Nose:  No deformity, discharge,  or lesions. Mouth:  No deformity or lesions, dentition normal. Neck:  Supple; no masses or thyromegaly. Lungs:  Clear throughout to auscultation.   No wheezes, crackles, or rhonchi. No acute distress. Heart:  Regular rate and rhythm; no murmurs, clicks, rubs,  or gallops. Abdomen:  Soft, nontender and nondistended. No masses, hepatosplenomegaly or hernias noted. Normal bowel sounds, without guarding, and without rebound.   Msk:  Symmetrical without gross deformities. Normal posture. Pulses:  Normal pulses noted. Extremities:  Without clubbing or edema. Neurologic:  Alert and  Oriented x 1;  grossly normal neurologically. Skin:  Intact without significant lesions or rashes. Cervical Nodes:  No significant cervical adenopathy. Psych:  Alert and cooperative. Normal mood and affect.  Confused at times.

## 2011-04-17 NOTE — Assessment & Plan Note (Signed)
Small to moderate volume rectal bleeding patient feels secondary to hemorrhoids.  I have offered colonoscopy but given pt's age, they are declining at this point.  Will treat symptomatically for now.

## 2011-04-17 NOTE — Assessment & Plan Note (Signed)
[name] is a 77 y.o. female w/ recent dysphagia, weight loss of greater than 30# & reported abnormal curvature of esophagus.  Unfortunately, I do not have x-rays for my review.  She has been evaluated by Speech Pathologist & records have been requested. Hx PUD & perforated viscus s/p lap & gastrorrhaphy.  S/p h pylori treatment.   Will review xray & make suggestions for further management.  I suspect an oropharyngeal component, however she may have a structural abnormality as well.

## 2011-04-17 NOTE — Progress Notes (Signed)
Cc to PCP 

## 2011-04-18 NOTE — Progress Notes (Signed)
Spoke w/ Havery Moros, SLP--will review notes & fax to our office.

## 2011-04-22 NOTE — Progress Notes (Signed)
Offered EGD w/ ED to pt's POA, sister (contact info below).  Disc risks/benefits.  She would like to discuss further w/ her siblings.  She will call us back w/ plan.

## 2011-04-22 NOTE — Progress Notes (Signed)
Modified Ba Swallow Study per D. Hale Bogus reviewed:  12.10mm Ba tablet remained in distal esophagus & did not pass w/ liquid wash, but was eventually pushed through after 4-5 bites of applesauce.  MInimal oropharyngeal dysphagia.  Placed  On Dysphagia III diet, reflux precautions.  Will need EGD w/ possible esophageal dilation w/ Dr. Darrick Penna re: esophageal dysphagia.

## 2011-04-29 ENCOUNTER — Telehealth: Payer: Self-pay | Admitting: Urgent Care

## 2011-04-29 NOTE — Telephone Encounter (Signed)
CC: Dr Felecia Shelling

## 2011-04-29 NOTE — Telephone Encounter (Signed)
Cc to Dr. Felecia Shelling

## 2011-04-29 NOTE — Telephone Encounter (Signed)
Spoke w/ pt's sister P. [facility] Family has decided that they do not want to proceed w/ EGD She is doing much better w/ eating. On mechanical soft diet.  Using Boost at nursing home. Instructed to call if they change their minds re:EGD or have any further problems/questions,  Cc:

## 2011-04-30 NOTE — Assessment & Plan Note (Signed)
[facility]                         ELECTROPHYSIOLOGY OFFICE NOTE   [name], [name]                     MRN:          [id number]  DATE:04/13/2008                            DOB:          1921/10/06    HISTORY:  [name] returns today for her initial followup.  She is a  very pleasant 77 year old woman with a history of complete heart block  and hypertrophic cardiomyopathy.  The patient had pause dependent  polymorphic ventricular tachycardia back in January and underwent  permanent pacemaker insertion by Dr. Graciela Husbands.  She returns today for  followup.  She does have some coronary disease, but this is not severe.  Her LV function is preserved.  She has 3+ mitral regurgitation.  Since  her pacemaker was placed, the patient  has done well.  She denies chest  pain.  She has had no significant shortness of breath.  She does have  some heart failure symptoms presently class II.   CURRENT MEDICATIONS:  1. Crestor 5 mg a day.  2. Metoprolol 25 twice a day.  3. Cardizem 120 daily.  4. Lisinopril 10 mg daily.   PHYSICAL EXAMINATION:  GENERAL:  She is a pleasant elderly-appearing  woman in no distress.  VITAL SIGNS:  Blood pressure was 140/78, pulse 74 and regular,  respirations were 18.  Weight was 128 pounds.  NECK:  Revealed no jugular venous distention.  LUNGS:  Clear bilaterally to auscultation.  No wheezes, rales or rhonchi  are present.  CARDIAC:  Regular rate and rhythm with a grade 3/6  systolic murmur at the left lower sternal border and the right upper  sternal border.  This murmur is increased slightly with inspiration.  ABDOMINAL:  Soft, nontender.  EXTREMITIES:  Demonstrated no edema.   PROCEDURE:  Interrogation of the pacemaker demonstrates a Medtronic  Versa.  The P-waves were 2.  There are no R-waves secondary to complete  heart block.  The impedance was 500 in the A and 550 in the V.  The  threshold 0.5 to 0.4 in the A and 1.4 in  the V.  The battery voltage was  2.79 volts.   IMPRESSION:  1. Complete heart block.  2. Ventricular tachycardia.  3. Hypertrophic cardiomyopathy.   DISCUSSION:  Overall, [name] is stable.  Her pacemaker is working  normally.  She has had no recurrent syncopal episodes and has gone back  to her usual activities.  We will plan to see her back in January 2010,  sooner should she have additional problems.     Doylene Canning. Ladona Ridgel, MD  Electronically Signed    GWT/MedQ  DD: 04/13/2008  DT: 04/13/2008  Job #: [id number]   cc:   Angus G. Renard Matter, MD

## 2011-04-30 NOTE — Assessment & Plan Note (Signed)
[facility]                         ELECTROPHYSIOLOGY OFFICE NOTE   UMEKA, WRENCH                     MRN:          [id number]  DATE:01/11/2009                            DOB:          Nov 13, 1921    Ms. Losier returns today for followup.  She is a very pleasant elderly  woman with symptomatic bradycardia and pause-dependent polymorphic VT  and complete heart block.  She is status post pacemaker insertion today.  She returns today for followup.  She denies chest pain.  She notes that  her appetite has been slightly decreased, otherwise she had been stable.   MEDICATIONS:  1. Metoprolol 25 twice a day.  2. Cardizem CD 120 a day.  3. Lisinopril 10 a day.  4. Aspirin 325 a day.  5. Lipitor 20 a day.   PHYSICAL EXAM:  GENERAL:  She is a pleasant elderly woman in no  distress.  VITAL SIGNS:  Blood pressure today was 150/80, the pulse 68 and regular,  respirations were 18.  Weight was 123 pounds.  NECK:  No jugular distention.  LUNGS:  Clear bilaterally to auscultation.  No wheezes, rales, or  rhonchi present.  There is no increased work of breathing.  CARDIAC:  Regular rate and rhythm.  Normal S1 and a split S2.  PMI was  not enlarged nor was it laterally displaced.   Interrogation of her pacemaker demonstrates Medtronic Versa, the P waves  were 1, there were no R waves secondary to complete heart block.  The  impedance is 464 in the A, 546 in the RV, the threshold is 0.5 at 0.4 in  the RA, 0.625 at 0.4 in the RV, the battery voltage is 2.79 volts.  She  was 95% A paced and 99.9% V paced.   IMPRESSION:  1. Symptomatic bradycardia.  2. Status post pacemaker insertion.  3. Hypertension.   DISCUSSION:  Overall Ms. Khurana is stable.  Her pacemaker is working  normally.  Her her blood pressure is slightly elevated.  She will  continue to maintain a low-salt diet and continue her cardiac  medications.  I will see her back in the office in  1 year.     Doylene Canning. Ladona Ridgel, MD  Electronically Signed    GWT/MedQ  DD: 01/11/2009  DT: 01/12/2009  Job #: [id number]

## 2011-04-30 NOTE — Op Note (Signed)
NAMEELENORA, HAWBAKER              ACCOUNT NO.:  [id number]   MEDICAL RECORD NO.:  [id number]          PATIENT TYPE:  INP   LOCATION:  2904                         FACILITY:  [facility]   PHYSICIAN:  Duke Salvia, MD, FACCDATE OF BIRTH:  1921-03-02   DATE OF PROCEDURE:  DATE OF DISCHARGE:                               OPERATIVE REPORT   PREOPERATIVE DIAGNOSIS:  High-grade and intermittent complete heart  block with pause-dependent polymorphic ventricular tachycardia  consistent with torsade de pointes.   POSTOPERATIVE DIAGNOSIS:  High-grade and intermittent complete heart  block with pause-dependent polymorphic ventricular tachycardia  consistent with torsade de pointes.   PROCEDURE:  Dual-chamber pacemaker implantation with removal under  fluoroscopy of a previously implanted transvenous pacemaker.   SURGEON:  Duke Salvia, MD   DESCRIPTION OF PROCEDURE:  Following attainment of informed consent, the  patient was brought to the electrophysiology laboratory and placed on  the fluoroscopic table in the supine position.  After routine prep and  drape of the left upper chest, lidocaine was infiltrated in the  prepectoral/subclavicular region.  Incision was made and carried down to  a layer of the prepectoral fascia using electrocautery and sharp  dissection.  Hemostasis was obtained.  Thereafter, attention was turned  to gaining access to extrathoracic left subclavian vein, which was  accomplished without difficulty and without the aspiration of air or  puncture of the artery.  Two separate venipunctures were accomplished.  Guidewires were placed and retained and a 0 silk suture was placed in a  figure-of-eight fashion and allowed to hang loosely.  Sequentially, 7-  French sheath were placed through which were passed a Medtronic 5076  fifty-two-centimeter active-fixation ventricular lead, serial number  [id number] and a Medtronic 5076 forty-five-centimeter active-fixation  atrial  lead, serial number [id number].   Under fluoroscopic guidance, the ventricular lead was manipulated into  the right ventricular apex, as this has been shown in patients with  hypertrophic cardiomyopathy with obstruction to cause some dyssynchrony  of the interventricular septum and hopefully thereby reduce some of the  gradient.  The atrial lead was manipulated into the right atrial  appendage.  The ventricular paced R wave was 6 with a pacing impedance  of 784, a threshold of 0.6 volts at 0.5 milliseconds.  The current at  threshold was 1 mA.  There was no diaphragmatic pacing at 10 volts the  current of injury was brisk.   The bipolar P wave was 2.9 with a pacing impedance of 701 ohms, a  threshold shortly after screw deployment of 1.5 volts at 0.5  milliseconds.  The current of threshold was 2.9 mA.  There was no  diaphragmatic pacing at 10 volts and the current of injury was brisk.  The ventricular lead was marked with a tie.   The leads were then attached to a Medtronic Versa Vedro 1 pulse  generator, serial number [id number].  P-synchronous pacing was  identified.  The pocket was copiously irrigated with antibiotic-  containing saline solution.  Hemostasis was assured.  The pocket was  copiously irrigated with antibiotic-containing saline solution and the  leads and  the pulse generator were then placed in the pocket and secured  to the prepectoral fascia.  There was trifle of oozing at the cephalad  medial aspect of the pocket so I put a small piece of Surgicel there.  We then closed the  pocket in 2 layers in the normal fashion.  The wound was washed, dried  and a benzoin and Steri-Strip dressing was applied.  Needle counts,  sponge counts and instrument counts were correct at the end of the  procedure according to the staff.  The patient tolerated the procedure  without apparent complications.      Duke Salvia, MD, [facility]  Electronically Signed     SCK/MEDQ  D:   12/18/2007  T:  12/18/2007  Job:  [id number]   cc:   Dani Gobble, MD  Attn:  Elige Radon [facility]  [facility] Electrophysiology Laboratory

## 2011-04-30 NOTE — Cardiovascular Report (Signed)
[name], [name]              ACCOUNT NO.:  [id number]   MEDICAL RECORD NO.:  [id number]          PATIENT TYPE:  INP   LOCATION:  2904                         FACILITY:  [facility]   PHYSICIAN:  Thereasa Solo. Little, M.D. DATE OF BIRTH:  06-09-1921   DATE OF PROCEDURE:  12/17/2007  DATE OF DISCHARGE:  12/17/2007                            CARDIAC CATHETERIZATION   INDICATIONS FOR TEST:  This 77 year old female was transferred from  [facility] earlier this morning with second-degree AV block  with a rate of 30.  When she was placed on a monitor, it was noted that  she, in fact, was having intermittent episodes of complete heart block,  along with what appeared to be second-degree AV block.  She has a  chronic right bundle.  She was also noted to have episodes of  ventricular tachycardia, the longest one being approximately 30 beats in  duration.  Because of this, the decision was made to bring her cath lab  and put pacemaker wire and to do a diagnostic catheter to rule out  ischemia as the etiology for both the AV block and the ventricular  tachycardia.   She has a known hypertrophic myopathy with an outflow tract gradient.   After obtaining informed consent, the patient was prepped and draped in  the usual sterile fashion exposing the right groin.  Even before she was  draped, she had an episode of spontaneous VF and was defibrillated with  120 watt seconds.  She came back with a single shock in her third-degree  AV block with a rate of about 30.  Neurologically, she was completely  intact after the event, other than she was sleepy.   A temporary pacemaker was advanced through the right femoral artery via  6-French sheath.  It was placed in the apex of the right ventricle and  had good capture to an MA of 1.  The pacer settings are currently rate  of 60 MA of 5.   Left heart catheterization including selective left and right coronary  arteriography, ventriculography and an  aortic root injection was  performed.  In addition to this, a right coronary catheter was placed  both at the apex of the heart and pullback into the subvalvular area for  pressure monitoring.   RESULTS:  1. Hemodynamic monitoring. Her central aortic pressure was 135/51.      Her LV pressure at the apex was 133/10, when the right coronary      catheter was pulled back to the subvalvular area, the pressure      there was equal to her aortic pressure.  This was repeated using a      pigtail catheter, that clearly gives an 83 mm gradient.  This is      not aortic stenosis, but a subvalvular gradient as result of a      hypertrophic myopathy.   Ventriculography.  Ventriculography in the RAO projection revealed  marked left ventricular hypertrophy.  The ejection fraction was  approximately 50%.  There was mid cavity obliteration during systole.  There was +3 mitral regurgitation.  The left  atrium was clearly dilated.  The left ventricular end-diastolic pressure was 21.  1. Aortic root aortic root injection revealed mild AI.   CORONARY ARTERIOGRAPHY:  Calcification was noted in the distribution of  proximal LAD on fluoroscopy.   1. Left main.  The left main had minor had mild irregularities and was      slightly tortuous, but there was no high-grade stenosis in the left      main and there was no damping of my catheter.  2. Circumflex.  The ongoing circumflex after a first OM had an area of      80% narrowing.  Following this obstruction,  the circumflex      trifurcated into three small branches.  The large OM that came off      proximal to the obstruction was widely patent.  3. LAD.  The LAD extended to the apex of the heart and gave rise to      the first diagonal.  There was mild irregularities in both these      vessels.  4. Right coronary artery mild irregularities in the proximal and      midsegment. The PDA was free of disease.   CONCLUSION:  1. Moderate-severe ongoing  circumflex disease.  2. +3 mitral regurgitation.  3. +1 aortic insufficiency.  4. Complete heart block now with a temporary pacemaker.  5. Hypertrophic cardiomyopathy with an aortic outflow gradient of 83      mmHg.  6. VT VF   Unfortunately, this lady will need an AICD because of her rhythm issues.  I have called Dr. Graciela Husbands to discuss this with him.  Once her rhythm is  stable, she will be brought back in for elective intervention to her OM  system.   Because of her age, I did not feel that she needs to be considered for  mitral valve replacement and she is not symptomatic from a shortness of  breath, congestive heart failure, dyspnea on exertion standpoint.   She left the cath lab with a temporary pacemaker still in place.  She  was alert and oriented to place and situation.           ______________________________  Thereasa Solo Little, M.D.     ABL/MEDQ  D:  12/17/2007  T:  12/17/2007  Job:  [id number]   cc:   Dani Gobble, MD  Ishmael Holter. Renard Matter, MD

## 2011-04-30 NOTE — Procedures (Signed)
[name], [name]              ACCOUNT NO.:  [id number]   MEDICAL RECORD NO.:  [id number]          PATIENT TYPE:  OUT   LOCATION:  RAD                           FACILITY:  [facility]   PHYSICIAN:  Dani Gobble, MD       DATE OF BIRTH:  Aug 09, 1921   DATE OF PROCEDURE:  09/30/2007  DATE OF DISCHARGE:                                ECHOCARDIOGRAM   INDICATIONS:  An 77 year old female with a murmur since birth who is  referred to evaluate for the possibility of aortic stenosis.   The technical quality of this study is adequate.   The aorta measures normally at 2.1 cm.   The left atrium is moderate to markedly dilated measured at 5.2 cm.  The  patient appeared to be in sinus rhythm during this procedure.   The interventricular septum is markedly thickened in the proximal to mid  range at approximately 2-3 cm.  The posterior wall is a moderately  thickened at 1.5 cm.   The aortic valve is trileaflet with good leaflet opening.  There is mild  to moderate thickening and calcification.  Mild aortic insufficiency is  appreciated.   The mitral valve is mildly thickened but without limitation to leaflet  excursion.  No mitral valve prolapse is noted.  Systolic anterior motion  of mitral valve is appreciated.  There does appear to be an obstruction  to flow in the LVOT.  Moderate to severe mitral regurgitation is  appreciated as an eccentric anteriorly directed jet with possible  pulmonary vein flow reversal.  Doppler interrogation of mitral valve is  within normal limits.   The pulmonic valve appears grossly structurally normal.   Tricuspid valve also appears grossly structurally normal with mild  tricuspid regurgitation noted.   The left ventricle is small in cavity size with the LV IDD measured 3.6  cm and LV IC measured 2.6 cm.  Overall left ventricular systolic  function is vigorous to hyperdynamic with an estimated ejection fraction  of 70-75%.  There is virtual mid cavitary  systolic obliteration.  There  is clearly a gradient of approximately 64 mmHg in the LVOT which is  probably multifactorial between her marked basal septal hypertrophy  along with systolic anterior motion of mitral valve.  At the present,  diastolic dysfunction is inferred.   The right ventricle is normal in size with normal right ventricular  systolic function.  There is mild right ventricle hypertrophy.  The  right atrium appears grossly normal in size..  The IVC is not well seen.  I do not appreciate pericardial effusion.   IMPRESSION:  1. Moderate left atrial enlargement.  2. Severe septal hypertrophy, particularly in the basal to mid range      as an overlay to moderate concentric LVH.  3. Trileaflet.  Mild to moderately thickening calcified aortic valve      with good leaflet opening and mild aortic insufficiency.  4. Moderate to severe eccentric jet of mitral regurgitation directed      anteriorly with possible pulmonary vein flow reversal.  5. Systolic anterior motion of mitral valve with LVOT  obstruction.  6. Mild tricuspid regurgitation.  7. Small left ventricular cavity size with vigorous to hyperdynamic      left systolic function and estimated ejection fraction of 70-75%.      No regional wall motion abnormalities noted.  8. As above, diastolic dysfunction is inferred  9. The constellation of findings of markedly thickened proximal to mid      septal severe hypertrophy along with systolic anterior motion of      mitral valve and LVOT obstruction is certainly suggestive of HOCM      (hypertrophic obstructive cardiomyopathy), however, the fact that      the severe hypertrophy does not extend through the entire septal      wall is a bit more suggestive of possible hypertensive etiology in      concert with her advanced age.  Regardless, she certainly has a      severe LVOT obstruction and gradient of 64 mmHg along with mild AI      and moderate to severe MR            ______________________________  Dani Gobble, MD     AB/MEDQ  D:  09/30/2007  T:  10/01/2007  Job:  [id number]   cc:   Angus G. Renard Matter, MD  Fax: [phone]

## 2011-04-30 NOTE — Consult Note (Signed)
NAMENARJIS, MIRA NO.:  [id number]   MEDICAL RECORD NO.:  [id number]          PATIENT TYPE:  INP   LOCATION:  2904                         FACILITY:  [facility]   PHYSICIAN:  Duke Salvia, MD, FACCDATE OF BIRTH:  [birth year]   DATE OF CONSULTATION:  12/18/2007  DATE OF DISCHARGE:                                 CONSULTATION   Thank you very much for asking [place] to see [name] in consultation  because of high-grade heart block and ventricular tachycardia.   [name] is an 77 year old woman with known hypertrophic obstructive  cardiomyopathy, moderate mitral regurgitation, mild depression with  normal LV function who has had no obvious functional impairment.  She  had a couple episodes of lightheadedness earlier this week and because  of one such episode she had her sister take her blood pressure on  Thursday.  Her machine was unable to count her blood pressure and she  took herself to [facility] where she was found to be in high-grade heart  block.  Path report says mild LV dysfunction.  Upon arrival at [facility] she was found to be in high-grade heart block.  She was transferred  to [facility] where she ended up having evidence of ventricular  tachycardia and because of that was taken to the catheterization lab by  Dr. Clarene Duke where she was noted to have a gradient of about 65 mm, a high-  grade circ lesion, 3+ MR, modest elevation the LV end-diastolic  pressure.  A transvenous pacemaker was placed.  We are consulted for  management.   The patient's functional status is surprisingly good.  She denies  exercise intolerance.  She has had no prior episodes of lightheadedness  or syncope.  She has no children.  Apparently her mother had a diagnosis  of hypertrophic cardiomyopathy.   PAST MEDICAL HISTORY:  Is notable for hypertension.  Her past cardiac  evaluation including the echo included a negative stress Myoview from  November.   PAST  SURGICAL HISTORY:  Is notable for appendectomy.   SOCIAL HISTORY:  She is widowed.  She lives alone.  She has no children.  She is retired.  She does not use cigarettes, alcohol or recreational  drugs.   FAMILY HISTORY:  Family history is notable for the hypertrophic heart  disease in her mother.  I do not know the status of her sisters.   MEDICATIONS:  Include Crestor 5, metoprolol tartrate 25 b.i.d.,  lisinopril 10 b.i.d. and Actonel.   ALLERGIES:  No known drug allergies.   REVIEW OF SYSTEMS:  Her review of systems in addition to above is  notable for glasses and arthritis and is otherwise negative as noted on  the intake sheet.   PHYSICAL EXAMINATION:  GENERAL:  On examination she is an elderly  Caucasian female appearing her stated age of 69.  VITAL SIGNS:  Her blood pressure is 134/49.  Her pulse was 60 and paced.  Her respirations were 22.  HEENT:  Exam demonstrated no icterus or xanthoma.  NECK:  The neck veins were flat.  The  carotids were brisk and full with  a transmitted murmur.  BACK:  Was not examined though she was flat in bed.  LUNGS:  Clear bilaterally.  HEART:  Sounds were regular with a 4/6 systolic murmur heard along the  right upper sternal border.  There is also a holosystolic murmur heard  at the apex.  ABDOMEN:  Soft with active bowel sounds without midline pulsation or  hepatomegaly.  Femoral pulses were not examined.  Distal pulses were  trace.  There is no clubbing, cyanosis or edema.  NEUROLOGICAL:  Exam was grossly normal.  SKIN:  Her skin was warm and dry.   Electrocardiogram dated 1 January demonstrated 3:1 heart block with a  narrow like QRS at 138 milliseconds with a 3:1 conduction.   Telemetry strips demonstrated intermittent complete heart block with  broader QRS; there were episodes of pause dependent ventricular ectopy,  both PVCs, couplets, nonsustained polymorphic ventricular tachycardia,  often coming in salvos.  QT interval on some of  these tracings were as  long as 680 milliseconds.  Obviously with correction for bradycardia it  is shorter than that.  Her potassium was 4.2.  Her magnesium was 2.2.   IMPRESSION:  1. Recurrent episodes of polymorphic ventricular tachycardia - pause      dependent and QT prolongation associated.  2. High-grade heart block giving rise to #1.  3. Hypertrophic cardiomyopathy.      a.     Gradient of 80-90 mm.      b.     MR 2+ related to systolic anterior motion of the mitral       valve.      c.     Ejection fraction of 75%.  4. Hypertension.  5. Anemia.   DISCUSSION:  [name] has had a terrible day, likely all related to  bradycardia and subsequent sequelae including pause dependent  polymorphic VT which was mostly self terminating but on one occasion  needed to be shocked.  Following insertion of the temporary transvenous  pacemaker no further ventricular tachycardia has been seen.   Her primary issue is bradycardia; the VT is secondary to the bradycardia  and as such should be adequately treated by prevention of pausing.  To  that end I think dual chamber pacemaker implantation is appropriate.   In the context of hypertrophic cardiomyopathy dyssynchronous activation  of the left ventricular septum has been used more remotely for symptom  modification and we will plan to put the lead there as opposed to in the  septum as dyssynchrony may be of benefit with hopefully decreased  systolic anterior motion of the mitral valve and subsequent reduction of  her mitral regurgitation.   I have reviewed this with her as well as with Dr. Clarene Duke, specifically  the issue of the ICD which I do not think is indicated in this patient  who has pause dependent polymorphic VT.   They understand and are willing to proceed.  We reviewed the potential  complications including but not limited to death, perforation, infection  and lead dislodgement.   The patient is relatively ambidextrous.   She writes with her right hand;  she does a lot of other things with her left hand.  We will plan to  proceed with left-sided implantation.   Please note that this was dictated after the procedure was undertaken.      Duke Salvia, MD, [facility]  Electronically Signed     SCK/MEDQ  D:  12/18/2007  T:  12/18/2007  Job:  [id number]   cc:   Thereasa Solo. Little, M.D.  Dani Gobble, MD  Electrophysiology Laboratory

## 2011-04-30 NOTE — Discharge Summary (Signed)
[name], APPERSON              ACCOUNT NO.:  [id number]   MEDICAL RECORD NO.:  [id number]          PATIENT TYPE:  INP   LOCATION:  2036                         FACILITY:  [facility]   PHYSICIAN:  Thereasa Solo. Little, M.D. DATE OF BIRTH:  1921/02/09   DATE OF ADMISSION:  12/17/2007  DATE OF DISCHARGE:  12/22/2007                               DISCHARGE SUMMARY   DISCHARGE DIAGNOSES:  1. High-grade heart block.      a.     Placement of a permanent transvenous pacemaker, dual chamber       Medtronic Versa Vedro 1 pulse generator, serial [id number] H.      b.     Patient was symptomatic with one episode of near syncope and       feeling extremely weak and tired and short of breath.  2. Recurrent episodes of polymorphic ventricular tachycardia pause      dependent secondary to #1.  3. Hypertrophic cardiomyopathy with gradient of 80-90 mm.  4. Coronary artery disease, emergent catheterization with first obtuse      marginal artery with 80% stenosis, large vessel. Left main with      moderate irregularities.  Left anterior descending patent. Right      coronary artery with mild irregularities, and posterior descending      artery was okay. Ejection fraction at catheterization was greater      than 50% with mid cavity obliteration, and 3+ mitral regurgitation.  5. Ventricular fibrillation arrest prior to cardiac catheterization in      the catheterization lab on December 17, 2007, resolved with      defibrillation times one.  6. Hypertension.  7. Hyperlipidemia.  8. Some memory loss.  9. History of osteoporosis.  10.Acute gout episode of right knee and right ankle prior to discharge      relieved with colchicine.  11.Thrombocytopenia.   DISCHARGE CONDITION:  Improved.   PROCEDURES:  1. Emergent cardiac catheterization on December 17, 2007 by Dr. Julieanne Manson with results as above.  2. Emergent temporary pacemaker for third degree AV block and pulse      related ventricular tachycardia  and one episode of V-fib with      defibrillation x on December 17, 2007.  3. On December 18, 2007, placement of a dual chamber pacemaker, a      Medtronic Versa Vedro 1 pulse generator in the left subclavian area      by Dr. Sherryl Manges.   DISCHARGE MEDICATIONS:  1. Crestor 5 mg at h.s.  2. Aspirin 325 mg daily.  3. Lopressor 1/2 tablet b.i.d. as before (which is 25 mg b.i.d.).  4. Cardizem CD 120 mg daily.  5. Claritin 10 mg daily if needed for allergies.  6. Colchicine 0.6 mg one daily.  (Ask primary care about      continuation).  7. Nitroglycerin 1/150 sublingual p.r.n. for chest pain.  8. Lisinopril 10 mg daily (new dose).   WOUND CARE:  Please keep incision dry at the pacemaker site for the next  7 days, sponge bath until Friday, December 24, 2006.  ACTIVITY:  As per pacemaker sheet.   FOLLOW UP:  Dr. Graciela Husbands December 30, 2006 and 0920 Hr.  Dr. Renard Matter for gout.   AT TIME OF DISCHARGE PATIENT PREFERS TO TRANSFER CARE TO [facility]  - as [name] sister who has an AICD sees [facility] and  [name] sister stays with [name] frequently and this would be more convenient  for them to follow up with [facility].  Patient instructed to keep  appointment on January 15 and also notify them that [name] would like to  transfer care for cardiology.  Also notify the card master of that  request.   HISTORY OF PRESENT ILLNESS:  An 77 year old white female with history of  severe septal hypertrophy moderate to severe MR and left ventricular  outflow obstruction, EF of 70-75%, mild AI, presented to the emergency  room at [facility] with dizziness which was intermittent, started  several days ago and [name] also complained to Dr. Clarene Duke of one episode of  very near syncope and was getting ready to lie on the floor, and [name] did  fall.  [name] also had associated nausea.  [name] was seen at [facility] and  transferred to [facility] for further evaluation.  On arrival [name] originally  was told [name] was just  bradycardic but actually [name] was in  2:1 heart block.  [name] was then transferred to the CCU.  Heart rate was  in the 30's.  [name] had a good blood pressure and no dizziness in the  30's, and occasionally [name] would drop down into the 20's.  We had an  external pacer's on [name].  At that point with the bradycardia [name]  developed nonsustained ventricular tachycardia secondary to pauses so  [name] was transferred to the cath lab and temporary pacemaker was placed,  though prior to the procedure beginning, [name] did have one episode of V-  fib arrest and was defibrillated at 120 joules back into [name] heart  block.  [name] underwent cardiac catheterization as well as temporary  pacemaker.   PAST MEDICAL HISTORY:  1. Cardiac as previously stated.  [name] had a negative stress Myoview on      November 8.  2. Osteoporosis.  3. History of appendectomy.  4. Hypertension.   OUTPATIENT MEDICATIONS:  Crestor 5, Lisinopril 10 b.i.d., Metoprolol  tartrate 25 b.i.d. and Actonel 150.  [name] had not taken [name] medications  on the evening of December 31 nor on the morning of December 17, 2007  because [name] felt so bad.   ALLERGIES:  No known drug allergies.   FAMILY HISTORY:  Heart disease in two sisters who were older.  One has  an ICD, [name] has had strokes and rectal cancer also in [name] family.   SOCIAL HISTORY:  [name] lives alone but [name] sister comes to stay with [name]  at times at night.  No children.  [name] is retired. No tobacco, alcohol or  drug use.   REVIEW OF SYSTEMS:  See H&P.   PHYSICAL EXAMINATION:  VITAL SIGNS:  At discharge, blood pressure  141/76, pulse 70, respiratory rate is 18, temperature 97.8.  LUNGS:  Clear to auscultation bilaterally.  CHEST:  Pacer site was without hematoma.  Heart S1, S2 regular rate and  rhythm with a 2-3/6 systolic ejection murmur.  EXTREMITIES:  Without edema.  Right knee that had been swollen and warm  to touch on December 20, 2007 had resolved secondary to colchicine though  [name]  had normal uric acid on  labs.  [name] was able to ambulate with a  walker and [name] does have a walker at home.   LABORATORY DATA:  Hemoglobin 11.7 on admission, hematocrit 34.2, WBC  13.5 thousand, platelets 108,000, neutrophils 81, lymphs 10, mono's 9,  eos zero, basophils zero.  Please note - [name] has remained  thrombocytopenic throughout [name] hospitalization.  At discharge [name]  platelets were 117,000, hemoglobin 11.2, hematocrit 33.2, and WBC 8.6  thousand.  Protime on admission 14.2, INR of 1.1, PTT 28.   Sodium on admission 137, potassium 4, chloride 101, CO2 27, glucose 110,  BUN 29, creatinine 1.05 and that remained essentially stable.  TFR was  50-52 and at discharge greater than 50, total protein 6.2, albumin 3.7.  AST on admission 56, ALT 65, alkaline phosphatase 31, total bilirubin  1.9, magnesium 2.2, uric acid was 4.5.   Cardiac enzymes:  CK-MB's were negative at 31 and 2, and 35 with MB of  2, troponin peak was 0.35.  BNP on admission was 1304, prior to  discharge 1023, total cholesterol 156, triglycerides 69, HDL 44, LDL 98.  TSH 2.207.  Urinalysis on admission - large amount of hemoglobin, trace  leukocytes and at discharge a small amount of hemoglobin, otherwise  clear (prior [name] Foley being removed).  A urine culture was no growth.   EKGs 3:1 heart block with narrow right QRS at 138 milliseconds with 3:1  conduction, otherwise high-grade AV block.   Chest x-ray's - initial one without heart failure on December 18, 2007,  cardiomegaly, probable mild interstitial edema.  On December 19, 2007 -  new dual lead transvenous pacemaker in appropriate position, no evidence  of pneumonia. Stable cardiomegaly.   Right knee films done December 20, 2007 - chondrocalcinosis may indicate  CPPD, moderate sized right knee joint effusion with degenerative  changes, this resolved with colchicine.   HOSPITAL COURSE:  The patient was admitted emergently from [facility] after presenting  with 2:1 block, heart rates in the 30's.  [name]  was transported to [facility] and admitted initially to telemetry  until it was realized [name] was in 2:1 block.  [name] was then transferred  with an external pacemaker to the CCU.  Rates at that time had dropped  into the 20's.  Rates at that time had dropped into the 20's at times,  but [name] blood pressure was stable and [name] was asymptomatic as long as  [name] was lying in the bed.  Shortly thereafter [name] developed nonsustained  ventricular tachycardia secondary to bradycardia and [name] was taken  emergently to the cath lab.  Prior to starting the procedure [name] did  fibrillate x1 and was defibrillated with 120 joules.  Dr. Clarene Duke was  present at that time and [name] underwent temporary pacemaker insertion and  then cardiac catheterization with results as previously stated.  Consult  with Dr. Graciela Husbands for EP was obtained due to [name] hypertrophic  cardiomyopathy and he felt permanent pacemaker was the correct choice  for the patient. He placed a permanent pacemaker on December 18, 2007  which [name] tolerated well.  [name] continued to improve.  [name] was found to  have coronary disease and Dr. Clarene Duke felt this needed to be addressed  but it needed to be done as an outpatient once the pacemaker had healed.  [name] continued to improve with one issue on December 20, 2007.  [name]  developed right knee effusion, was warm to touch. No history of gout and  uric  acid was only 4.5 but [name] was also beginning to have pain in the  right ankle that was also slightly swollen and somewhat warm.  No calf  pain at all.  X-ray of [name] knee revealed an effusion and we started [name]  on colchicine and [name] had  improvement of that.  [name] also complained of upper respiratory symptoms  and was started on Claritin with improvement of those symptoms.  By  December 22, 2007 [name] was ambulating with a walker and doing quite well.  [name] will follow up with the [facility] as previously stated.       Darcella Gasman. [place], N.P.    ______________________________  Thereasa Solo Little, M.D.    LRI/MEDQ  D:  12/23/2007  T:  12/23/2007  Job:  [id number]   cc:   Angus G. Renard Matter, MD  Duke Salvia, MD, [facility]

## 2011-05-03 NOTE — Progress Notes (Signed)
Agree. EGD/dil in near future. Pt declined TCS at this time.

## 2011-09-05 LAB — COMPREHENSIVE METABOLIC PANEL
BUN: 29 — ABNORMAL HIGH
CO2: 27
Calcium: 9.5
Chloride: 101
Creatinine, Ser: 1.05
GFR calc Af Amer: >60
GFR calc non Af Amer: 50 — ABNORMAL LOW
Glucose, Bld: 110 — ABNORMAL HIGH
Total Bilirubin: 1.9 — ABNORMAL HIGH

## 2011-09-05 LAB — CBC
HCT: 33.2 — ABNORMAL LOW
HCT: 34.2 — ABNORMAL LOW
Hemoglobin: 10.6 — ABNORMAL LOW
Hemoglobin: 11.2 — ABNORMAL LOW
Hemoglobin: 11.7 — ABNORMAL LOW
MCHC: 33.5
MCHC: 34
MCHC: 34.2
MCHC: 34.5
MCV: 89.6
MCV: 90.6
Platelets: 109 — ABNORMAL LOW
Platelets: 117 — ABNORMAL LOW
RBC: 3.49 — ABNORMAL LOW
RBC: 3.88
RDW: 14
WBC: 8.6

## 2011-09-05 LAB — BASIC METABOLIC PANEL
BUN: 17
BUN: 17
CO2: 29
CO2: 29
Calcium: 8.8
Chloride: 98
Creatinine, Ser: 0.8
Creatinine, Ser: 1.01
GFR calc Af Amer: >60
GFR calc non Af Amer: 52 — ABNORMAL LOW
GFR calc non Af Amer: 59 — ABNORMAL LOW
GFR calc non Af Amer: >60
Glucose, Bld: 102 — ABNORMAL HIGH
Glucose, Bld: 95
Potassium: 3.6
Potassium: 4.1
Sodium: 131 — ABNORMAL LOW
Sodium: 140
Sodium: 140

## 2011-09-05 LAB — CK TOTAL AND CKMB (NOT AT ARMC)
CK, MB: 2
CK, MB: 2
Relative Index: INVALID
Total CK: 31
Total CK: 35

## 2011-09-05 LAB — I-STAT 8, (EC8 V) (CONVERTED LAB)
Glucose, Bld: 112 — ABNORMAL HIGH
Potassium: 3.8
TCO2: 28
pCO2, Ven: 49.5
pH, Ven: 7.33 — ABNORMAL HIGH

## 2011-09-05 LAB — DIFFERENTIAL
Basophils Absolute: 0
Lymphocytes Relative: 10 — ABNORMAL LOW
Lymphs Abs: 1.4
Neutro Abs: 10.9 — ABNORMAL HIGH
Neutrophils Relative %: 81 — ABNORMAL HIGH

## 2011-09-05 LAB — POCT CARDIAC MARKERS
CKMB, poc: 1.5
Operator id: 213931

## 2011-09-05 LAB — URINALYSIS, ROUTINE W REFLEX MICROSCOPIC
Bilirubin Urine: NEGATIVE
Bilirubin Urine: NEGATIVE
Glucose, UA: NEGATIVE
Ketones, ur: NEGATIVE
Nitrite: NEGATIVE
Protein, ur: NEGATIVE
Protein, ur: NEGATIVE
Urobilinogen, UA: 0.2
Urobilinogen, UA: 1

## 2011-09-05 LAB — URIC ACID: Uric Acid, Serum: 4.5

## 2011-09-05 LAB — URINE MICROSCOPIC-ADD ON

## 2011-09-05 LAB — APTT: aPTT: 28

## 2011-09-05 LAB — URINE CULTURE: Culture: NO GROWTH

## 2011-09-05 LAB — LIPID PANEL
Cholesterol: 156
LDL Cholesterol: 98
Triglycerides: 69

## 2011-09-05 LAB — TROPONIN I: Troponin I: 0.14 — ABNORMAL HIGH

## 2011-09-05 LAB — PROTIME-INR: Prothrombin Time: 14.2

## 2011-09-05 LAB — POCT I-STAT CREATININE
Creatinine, Ser: 1.3 — ABNORMAL HIGH
Operator id: 205141

## 2011-09-05 LAB — B-NATRIURETIC PEPTIDE (CONVERTED LAB): Pro B Natriuretic peptide (BNP): 1304 — ABNORMAL HIGH

## 2011-09-05 LAB — MAGNESIUM: Magnesium: 2.2

## 2011-11-15 ENCOUNTER — Encounter: Payer: Self-pay | Admitting: Internal Medicine

## 2011-11-18 ENCOUNTER — Ambulatory Visit (INDEPENDENT_AMBULATORY_CARE_PROVIDER_SITE_OTHER): Payer: Medicare Other | Admitting: *Deleted

## 2011-11-18 ENCOUNTER — Encounter: Payer: Self-pay | Admitting: Internal Medicine

## 2011-11-18 DIAGNOSIS — I441 Atrioventricular block, second degree: Secondary | ICD-10-CM

## 2011-11-18 LAB — PACEMAKER DEVICE OBSERVATION
AL THRESHOLD: 0.5 V
BAMS-0001: 165 {beats}/min
BATTERY VOLTAGE: 2.76 V
RV LEAD THRESHOLD: 0.5 V

## 2012-06-08 ENCOUNTER — Encounter: Payer: Self-pay | Admitting: Internal Medicine

## 2012-06-08 ENCOUNTER — Ambulatory Visit (INDEPENDENT_AMBULATORY_CARE_PROVIDER_SITE_OTHER): Payer: Medicare Other | Admitting: Internal Medicine

## 2012-06-08 VITALS — BP 115/69 | HR 85 | Resp 18 | Ht 62.0 in | Wt 118.0 lb

## 2012-06-08 DIAGNOSIS — Z950 Presence of cardiac pacemaker: Secondary | ICD-10-CM

## 2012-06-08 DIAGNOSIS — I10 Essential (primary) hypertension: Secondary | ICD-10-CM

## 2012-06-08 DIAGNOSIS — I459 Conduction disorder, unspecified: Secondary | ICD-10-CM

## 2012-06-08 LAB — PACEMAKER DEVICE OBSERVATION
AL AMPLITUDE: 0.5 mv
AL IMPEDENCE PM: 439 Ohm
AL THRESHOLD: 0.75 V
RV LEAD IMPEDENCE PM: 453 Ohm
RV LEAD THRESHOLD: 0.75 V

## 2012-06-08 NOTE — Assessment & Plan Note (Signed)
Her blood pressure is well controlled. Will continue her current meds and maintain a low sodium diet. 

## 2012-06-08 NOTE — Assessment & Plan Note (Signed)
Her device is working normally. She is approximately 3 years from ERI. Will recheck in several months.

## 2012-06-08 NOTE — Patient Instructions (Signed)
**Note De-Identified  Obfuscation** Your physician recommends that you schedule a follow-up appointment in: 1 year with telephone check on September 30

## 2012-06-08 NOTE — Progress Notes (Signed)
HPI [name] returns today for followup. She is a pleasant elderly woman with a h/o symptomatic bradycardia, s/p PPM, HTN, diastolic CHF and dementia. She has been stable in the interim. She notes that salty food does not agree with her. She denis chest pain, sob, or recent falls. No Known Allergies   Current Outpatient Prescriptions  Medication Sig Dispense Refill  . citalopram (CELEXA) 10 MG tablet 10 mg daily.       . diclofenac (VOLTAREN) 50 MG EC tablet 50 mg 2 (two) times daily.       Marland Kitchen docusate sodium (COLACE) 100 MG capsule Take 100 mg by mouth 2 (two) times daily.        . furosemide (LASIX) 40 MG tablet 20 mg every other day.       Marland Kitchen KLOR-CON M20 20 MEQ tablet 20 mEq daily.       . metoprolol tartrate (LOPRESSOR) 25 MG tablet 25 mg 2 (two) times daily.       Marland Kitchen NAMENDA 10 MG tablet 10 mg 2 (two) times daily.       Marland Kitchen omeprazole (PRILOSEC) 20 MG capsule Take 20 mg by mouth daily.        Marland Kitchen senna (SENOKOT) 8.6 MG tablet Take 1 tablet by mouth daily.        . simvastatin (ZOCOR) 10 MG tablet       . traMADol (ULTRAM) 50 MG tablet          Past Medical History  Diagnosis Date  . Alzheimer disease   . PUD (peptic ulcer disease) 01/14/2011    pyloric perforation, Dr . Lovell Sheehan  . H. pylori infection   . HTN (hypertension)   . Hypophosphatemia   . Hypoalbuminemia   . Neuropathy   . Arthritis   . Osteoporosis     ROS:   All systems reviewed and negative except as noted in the HPI.   Past Surgical History  Procedure Date  . Appendectomy 1947  . Perforated viscus 1/30/112    gastrorrhaphy-> Dr Lovell Sheehan  . Pacemaker placement      Family History  Problem Relation Age of Onset  . Rectal cancer Sister 2  . Coronary artery disease Sister   . Brain cancer Sister   . Stroke Sister   . Alzheimer's disease Sister   . Stroke Mother   . Stroke Father   . Alzheimer's disease Mother      History   Social History  . Marital Status: Widowed    Spouse Name: N/A   Number of Children: 0  . Years of Education: N/A   Occupational History  . retired     Designer, fashion/clothing   Social History Main Topics  . Smoking status: Never Smoker   . Smokeless tobacco: Not on file  . Alcohol Use: No  . Drug Use: No  . Sexually Active: No   Other Topics Concern  . Not on file   Social History Narrative   [facility] [place], [phone] states she is POA/HCPOA     BP 115/69  Pulse 85  Resp 18  Ht 5\' 2"  (1.575 m)  Wt 118 lb (53.524 kg)  BMI 21.58 kg/m2  Physical Exam:  Well appearing elderly woman, NAD HEENT: Unremarkable Neck:  No JVD, no thyromegally Lungs:  Clear with no wheezes, rales, or rhonchi HEART:  Regular rate rhythm, no murmurs, no rubs, no clicks Abd:  soft, positive bowel sounds, no organomegally, no rebound, no guarding Ext:  2  plus pulses, no edema, no cyanosis, no clubbing Skin:  No rashes no nodules Neuro:  CN II through XII intact, motor grossly intact  DEVICE  Normal device function.  See PaceArt for details.   Assess/Plan:

## 2012-07-13 ENCOUNTER — Encounter (HOSPITAL_COMMUNITY): Payer: Self-pay

## 2012-07-13 ENCOUNTER — Emergency Department (HOSPITAL_COMMUNITY): Payer: Medicare Other

## 2012-07-13 ENCOUNTER — Inpatient Hospital Stay (HOSPITAL_COMMUNITY)
Admission: EM | Admit: 2012-07-13 | Discharge: 2012-07-17 | DRG: 482 | Disposition: A | Discharge status: Skilled Nursing Facility | Payer: Medicare Other | Attending: Internal Medicine | Admitting: Internal Medicine

## 2012-07-13 DIAGNOSIS — S72002A Fracture of unspecified part of neck of left femur, initial encounter for closed fracture: Secondary | ICD-10-CM | POA: Diagnosis present

## 2012-07-13 DIAGNOSIS — Z800 Family history of malignant neoplasm of digestive organs: Secondary | ICD-10-CM

## 2012-07-13 DIAGNOSIS — G309 Alzheimer's disease, unspecified: Secondary | ICD-10-CM | POA: Diagnosis present

## 2012-07-13 DIAGNOSIS — M129 Arthropathy, unspecified: Secondary | ICD-10-CM | POA: Diagnosis present

## 2012-07-13 DIAGNOSIS — Z66 Do not resuscitate: Secondary | ICD-10-CM | POA: Diagnosis present

## 2012-07-13 DIAGNOSIS — D649 Anemia, unspecified: Secondary | ICD-10-CM | POA: Diagnosis not present

## 2012-07-13 DIAGNOSIS — K279 Peptic ulcer, site unspecified, unspecified as acute or chronic, without hemorrhage or perforation: Secondary | ICD-10-CM | POA: Diagnosis present

## 2012-07-13 DIAGNOSIS — M810 Age-related osteoporosis without current pathological fracture: Secondary | ICD-10-CM | POA: Diagnosis present

## 2012-07-13 DIAGNOSIS — Y9210 Unspecified residential institution as the place of occurrence of the external cause: Secondary | ICD-10-CM | POA: Diagnosis present

## 2012-07-13 DIAGNOSIS — Z823 Family history of stroke: Secondary | ICD-10-CM

## 2012-07-13 DIAGNOSIS — Z79899 Other long term (current) drug therapy: Secondary | ICD-10-CM

## 2012-07-13 DIAGNOSIS — W010XXA Fall on same level from slipping, tripping and stumbling without subsequent striking against object, initial encounter: Secondary | ICD-10-CM | POA: Diagnosis present

## 2012-07-13 DIAGNOSIS — S72143A Displaced intertrochanteric fracture of unspecified femur, initial encounter for closed fracture: Principal | ICD-10-CM | POA: Diagnosis present

## 2012-07-13 DIAGNOSIS — Z950 Presence of cardiac pacemaker: Secondary | ICD-10-CM

## 2012-07-13 DIAGNOSIS — Z8249 Family history of ischemic heart disease and other diseases of the circulatory system: Secondary | ICD-10-CM

## 2012-07-13 DIAGNOSIS — F0280 Dementia in other diseases classified elsewhere without behavioral disturbance: Secondary | ICD-10-CM | POA: Diagnosis present

## 2012-07-13 DIAGNOSIS — S72009A Fracture of unspecified part of neck of unspecified femur, initial encounter for closed fracture: Secondary | ICD-10-CM

## 2012-07-13 DIAGNOSIS — I10 Essential (primary) hypertension: Secondary | ICD-10-CM | POA: Diagnosis present

## 2012-07-13 DIAGNOSIS — Z808 Family history of malignant neoplasm of other organs or systems: Secondary | ICD-10-CM

## 2012-07-13 LAB — CBC WITH DIFFERENTIAL/PLATELET
Basophils Absolute: 0 10*3/uL (ref 0.0–0.1)
Eosinophils Absolute: 0.3 10*3/uL (ref 0.0–0.7)
Eosinophils Relative: 3 % (ref 0–5)
HCT: 34.2 % — ABNORMAL LOW (ref 36.0–46.0)
MCH: 30 pg (ref 26.0–34.0)
MCV: 90 fL (ref 78.0–100.0)
Monocytes Absolute: 0.8 10*3/uL (ref 0.1–1.0)
Platelets: 148 10*3/uL — ABNORMAL LOW (ref 150–400)
RDW: 14.3 % (ref 11.5–15.5)

## 2012-07-13 LAB — URINALYSIS, ROUTINE W REFLEX MICROSCOPIC
Glucose, UA: NEGATIVE mg/dL
Ketones, ur: NEGATIVE mg/dL
Leukocytes, UA: NEGATIVE
Nitrite: NEGATIVE
Protein, ur: NEGATIVE mg/dL
Urobilinogen, UA: 0.2 mg/dL (ref 0.0–1.0)

## 2012-07-13 LAB — BASIC METABOLIC PANEL
Calcium: 10.1 mg/dL (ref 8.4–10.5)
Creatinine, Ser: 1.06 mg/dL (ref 0.50–1.10)
GFR calc non Af Amer: 45 mL/min — ABNORMAL LOW (ref >90)
Glucose, Bld: 152 mg/dL — ABNORMAL HIGH (ref 70–99)
Sodium: 139 mEq/L (ref 135–145)

## 2012-07-13 LAB — URINE MICROSCOPIC-ADD ON

## 2012-07-13 MED ORDER — POVIDONE-IODINE 10 % EX SOLN
Freq: Once | CUTANEOUS | Status: AC
  Administered 2012-07-13: via TOPICAL
  Filled 2012-07-13: qty 118

## 2012-07-13 MED ORDER — PANTOPRAZOLE SODIUM 40 MG PO TBEC
40.0000 mg | DELAYED_RELEASE_TABLET | Freq: Every day | ORAL | Status: DC
  Administered 2012-07-15 – 2012-07-16 (×2): 40 mg via ORAL
  Filled 2012-07-13 (×2): qty 1

## 2012-07-13 MED ORDER — ALUM & MAG HYDROXIDE-SIMETH 200-200-20 MG/5ML PO SUSP: 30.0000 mL | Freq: Four times a day (QID) | ORAL | Status: DC | PRN

## 2012-07-13 MED ORDER — MEMANTINE HCL 10 MG PO TABS
10.0000 mg | ORAL_TABLET | Freq: Two times a day (BID) | ORAL | Status: DC
  Administered 2012-07-14 – 2012-07-17 (×6): 10 mg via ORAL
  Filled 2012-07-13 (×11): qty 1

## 2012-07-13 MED ORDER — MORPHINE SULFATE 4 MG/ML IJ SOLN
4.0000 mg | Freq: Once | INTRAMUSCULAR | Status: AC
  Administered 2012-07-13: 4 mg via INTRAVENOUS
  Filled 2012-07-13: qty 1

## 2012-07-13 MED ORDER — METOPROLOL TARTRATE 25 MG PO TABS
25.0000 mg | ORAL_TABLET | Freq: Two times a day (BID) | ORAL | Status: DC
  Administered 2012-07-14 – 2012-07-17 (×5): 25 mg via ORAL
  Filled 2012-07-13 (×7): qty 1

## 2012-07-13 MED ORDER — HYDROCODONE-ACETAMINOPHEN 5-325 MG PO TABS
1.0000 | ORAL_TABLET | ORAL | Status: DC | PRN
  Administered 2012-07-15 (×2): 2 via ORAL
  Administered 2012-07-15 – 2012-07-16 (×3): 1 via ORAL
  Administered 2012-07-17: 2 via ORAL
  Filled 2012-07-13 (×2): qty 2
  Filled 2012-07-13 (×2): qty 1
  Filled 2012-07-13 (×2): qty 2

## 2012-07-13 MED ORDER — CITALOPRAM HYDROBROMIDE 20 MG PO TABS
10.0000 mg | ORAL_TABLET | Freq: Every day | ORAL | Status: DC
  Administered 2012-07-15 – 2012-07-17 (×3): 10 mg via ORAL
  Filled 2012-07-13 (×6): qty 1

## 2012-07-13 MED ORDER — ONDANSETRON HCL 4 MG/2ML IJ SOLN
4.0000 mg | Freq: Once | INTRAMUSCULAR | Status: AC
  Administered 2012-07-13: 4 mg via INTRAVENOUS
  Filled 2012-07-13: qty 2

## 2012-07-13 MED ORDER — CEFAZOLIN SODIUM 1-5 GM-% IV SOLN: 1.0000 g | Freq: Once | INTRAVENOUS | Status: DC

## 2012-07-13 MED ORDER — SENNA 8.6 MG PO TABS
1.0000 | ORAL_TABLET | Freq: Every day | ORAL | Status: DC
  Administered 2012-07-15 – 2012-07-17 (×3): 8.6 mg via ORAL
  Filled 2012-07-13 (×5): qty 1

## 2012-07-13 MED ORDER — ACETAMINOPHEN 650 MG RE SUPP: 650.0000 mg | Freq: Four times a day (QID) | RECTAL | Status: DC | PRN

## 2012-07-13 MED ORDER — CEFAZOLIN SODIUM-DEXTROSE 2-3 GM-% IV SOLR
2.0000 g | Freq: Once | INTRAVENOUS | Status: AC
  Administered 2012-07-14: 2 g via INTRAVENOUS
  Filled 2012-07-13: qty 50

## 2012-07-13 MED ORDER — ACETAMINOPHEN 325 MG PO TABS: 650.0000 mg | ORAL_TABLET | Freq: Four times a day (QID) | ORAL | Status: DC | PRN

## 2012-07-13 MED ORDER — ONDANSETRON HCL 4 MG PO TABS: 4.0000 mg | ORAL_TABLET | Freq: Four times a day (QID) | ORAL | Status: DC | PRN

## 2012-07-13 MED ORDER — DOCUSATE SODIUM 100 MG PO CAPS
100.0000 mg | ORAL_CAPSULE | Freq: Two times a day (BID) | ORAL | Status: DC
  Administered 2012-07-14 – 2012-07-17 (×6): 100 mg via ORAL
  Filled 2012-07-13 (×10): qty 1

## 2012-07-13 MED ORDER — SODIUM CHLORIDE 0.9 % IV SOLN
INTRAVENOUS | Status: DC
  Administered 2012-07-14: 14:00:00 via INTRAVENOUS
  Administered 2012-07-14: 1000 mL via INTRAVENOUS
  Administered 2012-07-15: 05:00:00 via INTRAVENOUS
  Administered 2012-07-15: 75 mL/h via INTRAVENOUS
  Administered 2012-07-16: 12:00:00 via INTRAVENOUS

## 2012-07-13 MED ORDER — HYDROMORPHONE HCL PF 1 MG/ML IJ SOLN
0.5000 mg | INTRAMUSCULAR | Status: DC | PRN
  Administered 2012-07-13 – 2012-07-14 (×4): 0.5 mg via INTRAVENOUS
  Filled 2012-07-13 (×4): qty 1

## 2012-07-13 MED ORDER — ONDANSETRON HCL 4 MG/2ML IJ SOLN: 4.0000 mg | Freq: Four times a day (QID) | INTRAMUSCULAR | Status: DC | PRN

## 2012-07-13 NOTE — ED Provider Notes (Signed)
History   This chart was scribed for Dione Booze, MD by Melba Coon. The patient was seen in room APA01/APA01 and the patient's care was started at 7:37PM.    CSN: [id number]  Arrival date & time 07/13/12  1308   First MD Initiated Contact with Patient 07/13/12 1934      Chief Complaint  Patient presents with  . Fall    (Consider location/radiation/quality/duration/timing/severity/associated sxs/prior treatment) HPI [name] is a 78 y.o. female who presents to the Emergency Department complaining of constant, moderate to severe left ankle and left leg with an onset this morning pertaining to a fall with no head contact or LOC. Pt slipped on her foot and fell. Pt states that the pain is "bad" and states that while she fell, she did a semi-split with both legs going in opposite directions. Ambulation aggravates the pain; ambulation is decreased compared to baseline. Pt usually has a wheelchair. No HA, fever, neck pain, sore throat, rash, back pain, CP, SOB, abd pain, n/v/d, dysuria, or extremity edema, weakness, numbness, or tingling. No known allergies. No other pertinent medical symptoms.  Past Medical History  Diagnosis Date  . Alzheimer disease   . PUD (peptic ulcer disease) 01/14/2011    pyloric perforation, Dr . Lovell Sheehan  . H. pylori infection   . HTN (hypertension)   . Hypophosphatemia   . Hypoalbuminemia   . Neuropathy   . Arthritis   . Osteoporosis     Past Surgical History  Procedure Date  . Appendectomy 1947  . Perforated viscus 1/30/112    gastrorrhaphy-> Dr Lovell Sheehan  . Pacemaker placement     Family History  Problem Relation Age of Onset  . Rectal cancer Sister 17  . Coronary artery disease Sister   . Brain cancer Sister   . Stroke Sister   . Alzheimer's disease Sister   . Stroke Mother   . Stroke Father   . Alzheimer's disease Mother     History  Substance Use Topics  . Smoking status: Never Smoker   . Smokeless tobacco: Not on file  .  Alcohol Use: No    OB History    Grav Para Term Preterm Abortions TAB SAB Ect Mult Living                  Review of Systems 10 Systems reviewed and all are negative for acute change except as noted in the HPI.   Allergies  Review of patient's allergies indicates no known allergies.  Home Medications   Current Outpatient Rx  Name Route Sig Dispense Refill  . CITALOPRAM HYDROBROMIDE 10 MG PO TABS  10 mg daily.     Marland Kitchen DICLOFENAC SODIUM 50 MG PO TBEC  50 mg 2 (two) times daily.     Marland Kitchen DOCUSATE SODIUM 100 MG PO CAPS Oral Take 100 mg by mouth 2 (two) times daily.      . FUROSEMIDE 40 MG PO TABS  20 mg every other day.     Marland Kitchen KLOR-CON M20 20 MEQ PO TBCR  20 mEq daily.     Marland Kitchen METOPROLOL TARTRATE 25 MG PO TABS  25 mg 2 (two) times daily.     Marland Kitchen NAMENDA 10 MG PO TABS  10 mg 2 (two) times daily.     Marland Kitchen OMEPRAZOLE 20 MG PO CPDR Oral Take 20 mg by mouth daily.      . SENNOSIDES 8.6 MG PO TABS Oral Take 1 tablet by mouth daily.      Marland Kitchen  SIMVASTATIN 10 MG PO TABS      . TRAMADOL HCL 50 MG PO TABS        BP 165/78  Pulse 73  Temp 98.1 F (36.7 C) (Oral)  Resp 14  Ht 5\' 3"  (1.6 m)  Wt 120 lb (54.432 kg)  BMI 21.26 kg/m2  SpO2 97%  Physical Exam  Nursing note and vitals reviewed. Constitutional: She is oriented to person, place, and time. She appears well-developed and well-nourished. No distress.  HENT:  Head: Normocephalic and atraumatic.  Right Ear: External ear normal.  Left Ear: External ear normal.  Eyes: EOM are normal.  Neck: Normal range of motion. No tracheal deviation present.  Cardiovascular: Normal rate, regular rhythm and normal heart sounds.   No murmur heard. Pulmonary/Chest: Effort normal and breath sounds normal. No respiratory distress.  Abdominal: Soft. There is no tenderness.  Musculoskeletal: She exhibits tenderness (Moderate tenderness in lateral aspect of left hip; mild tenderness in lateral aspect of left thigh; marked pain with movement of left hip).        Leg leg externally rotated and slightly shortened  Neurological: She is alert and oriented to person, place, and time.  Skin: Skin is warm and dry. No rash noted.  Psychiatric: She has a normal mood and affect. Her behavior is normal.    ED Course  Procedures (including critical care time)  DIAGNOSTIC STUDIES: Oxygen Saturation is 97% on room air, normal by my interpretation.    COORDINATION OF CARE:  7:41PM - EDMD will order morphine, zofran, left hip XR, left femur XR, and blood w/u for the pt. 9:20PM - EDMDrecommends    Results for orders placed during the hospital encounter of 07/13/12  CBC WITH DIFFERENTIAL      Component Value Range   WBC 9.2  4.0 - 10.5 K/uL   RBC 3.80 (*) 3.87 - 5.11 MIL/uL   Hemoglobin 11.4 (*) 12.0 - 15.0 g/dL   HCT 16.1 (*) 09.6 - 04.5 %   MCV 90.0  78.0 - 100.0 fL   MCH 30.0  26.0 - 34.0 pg   MCHC 33.3  30.0 - 36.0 g/dL   RDW 40.9  81.1 - 91.4 %   Platelets 148 (*) 150 - 400 K/uL   Neutrophils Relative 67  43 - 77 %   Neutro Abs 6.1  1.7 - 7.7 K/uL   Lymphocytes Relative 21  12 - 46 %   Lymphs Abs 1.9  0.7 - 4.0 K/uL   Monocytes Relative 9  3 - 12 %   Monocytes Absolute 0.8  0.1 - 1.0 K/uL   Eosinophils Relative 3  0 - 5 %   Eosinophils Absolute 0.3  0.0 - 0.7 K/uL   Basophils Relative 0  0 - 1 %   Basophils Absolute 0.0  0.0 - 0.1 K/uL  BASIC METABOLIC PANEL      Component Value Range   Sodium 139  135 - 145 mEq/L   Potassium 4.0  3.5 - 5.1 mEq/L   Chloride 101  96 - 112 mEq/L   CO2 30  19 - 32 mEq/L   Glucose, Bld 152 (*) 70 - 99 mg/dL   BUN 39 (*) 6 - 23 mg/dL   Creatinine, Ser 7.82  0.50 - 1.10 mg/dL   Calcium 95.6  8.4 - 21.3 mg/dL   GFR calc non Af Amer 45 (*) >90 mL/min   GFR calc Af Amer 53 (*) >90 mL/min  TYPE AND SCREEN  Component Value Range   ABO/RH(D) A POS     Antibody Screen NEG     Sample Expiration 07/16/2012     Dg Chest 1 View  07/13/2012  *RADIOLOGY REPORT*  Clinical Data: Left intertrochanteric fracture  following a fall this morning.  CHEST - 1 VIEW  Comparison: 01/14/2011.  Findings: Enlarged cardiac silhouette without significant change. Stable left subclavian pacemaker leads.  Clear lungs.  Diffuse osteopenia.  No fracture pneumothorax seen.  IMPRESSION: No acute abnormality.  Stable cardiomegaly.  Original Report Authenticated By: Darrol Angel, M.D.   Dg Pelvis 1-2 Views  07/13/2012  *RADIOLOGY REPORT*  Clinical Data: Left hip and proximal leg pain following a fall today.  PELVIS - 1-2 VIEW  Comparison: None.  Findings: Left intertrochanteric fracture with varus angulation. Diffuse osteopenia.  IMPRESSION: Left intertrochanteric fracture with varus angulation.  Original Report Authenticated By: Darrol Angel, M.D.   Dg Femur Left  07/13/2012  *RADIOLOGY REPORT*  Clinical Data: Left hip and proximal leg pain following a fall today.  LEFT FEMUR - 2 VIEW  Comparison: Pelvis radiograph obtained at the same time.  Findings: Previously described left intertrochanteric fracture with varus angulation.  No additional fractures are seen and no dislocations are demonstrated.  Diffuse osteopenia.  Mild atheromatous arterial calcifications.  IMPRESSION: Left intertrochanteric fracture with varus angulation.  Original Report Authenticated By: Darrol Angel, M.D.    Date: 07/13/2012  Rate: 81  Rhythm: Electronic Ventricular Pacemaker  QRS Axis: left  Intervals: normal  ST/T Wave abnormalities: normal  Conduction Disutrbances:none  Narrative Interpretation: electronic ventricular pacemaker. When compared with ECG of 01/14/2011, no significant changes are noted, although atrial pacing is not present on current tracing.  Old EKG Reviewed: unchanged    1. Hip fracture       MDM  Fall with physical findings concerning for possible hip fracture. X-rays of been ordered.  X-rays confirm intertrochanteric left hip fracture. Case was discussed with Dr. Hilda Lias who agrees to see her in consult. Case is  discussed with Dr. Ouida Sills who agrees to admit the patient. Both are coming in the emergency department to evaluate the patient.  I personally performed the services described in this documentation, which was scribed in my presence. The recorded information has been reviewed and considered.            Dione Booze, MD 07/13/12 2213

## 2012-07-13 NOTE — ED Notes (Signed)
Family states that she told the same story about falling and a visitor picking her up yesterday. States that she was told that if she fell enough that she would get a wheel chair to get around in, instead of using a walker.

## 2012-07-13 NOTE — ED Notes (Signed)
Possibly fell this morning at the nurses desk, and a visitor picked her up. Complaining of left leg pain per pt.

## 2012-07-13 NOTE — H&P (Signed)
[name] is an 78 y.o. female.   Chief Complaint: left hip pain HPI: She fell at [facility]. She has pain in the left hip.  She normally uses a walker.  She has some tenderness of the right knee.  She has no other injury.  X-rays show a displaced left hip intertrochanteric fracture.  She had no head injury.  Past Medical History  Diagnosis Date  . Alzheimer disease   . PUD (peptic ulcer disease) 01/14/2011    pyloric perforation, Dr . Lovell Sheehan  . H. pylori infection   . HTN (hypertension)   . Hypophosphatemia   . Hypoalbuminemia   . Neuropathy   . Arthritis   . Osteoporosis     Past Surgical History  Procedure Date  . Appendectomy 1947  . Perforated viscus 1/30/112    gastrorrhaphy-> Dr Lovell Sheehan  . Pacemaker placement     Family History  Problem Relation Age of Onset  . Rectal cancer Sister 70  . Coronary artery disease Sister   . Brain cancer Sister   . Stroke Sister   . Alzheimer's disease Sister   . Stroke Mother   . Stroke Father   . Alzheimer's disease Mother    Social History:  reports that she has never smoked. She does not have any smokeless tobacco history on file. She reports that she does not drink alcohol or use illicit drugs.  Allergies: No Known Allergies   (Not in a hospital admission)  Results for orders placed during the hospital encounter of 07/13/12 (from the past 48 hour(s))  CBC WITH DIFFERENTIAL     Status: Abnormal   Collection Time   07/13/12  7:41 PM      Component Value Range Comment   WBC 9.2  4.0 - 10.5 K/uL    RBC 3.80 (*) 3.87 - 5.11 MIL/uL    Hemoglobin 11.4 (*) 12.0 - 15.0 g/dL    HCT 16.1 (*) 09.6 - 46.0 %    MCV 90.0  78.0 - 100.0 fL    MCH 30.0  26.0 - 34.0 pg    MCHC 33.3  30.0 - 36.0 g/dL    RDW 04.5  40.9 - 81.1 %    Platelets 148 (*) 150 - 400 K/uL    Neutrophils Relative 67  43 - 77 %    Neutro Abs 6.1  1.7 - 7.7 K/uL    Lymphocytes Relative 21  12 - 46 %    Lymphs Abs 1.9  0.7 - 4.0 K/uL    Monocytes  Relative 9  3 - 12 %    Monocytes Absolute 0.8  0.1 - 1.0 K/uL    Eosinophils Relative 3  0 - 5 %    Eosinophils Absolute 0.3  0.0 - 0.7 K/uL    Basophils Relative 0  0 - 1 %    Basophils Absolute 0.0  0.0 - 0.1 K/uL   BASIC METABOLIC PANEL     Status: Abnormal   Collection Time   07/13/12  7:41 PM      Component Value Range Comment   Sodium 139  135 - 145 mEq/L    Potassium 4.0  3.5 - 5.1 mEq/L    Chloride 101  96 - 112 mEq/L    CO2 30  19 - 32 mEq/L    Glucose, Bld 152 (*) 70 - 99 mg/dL    BUN 39 (*) 6 - 23 mg/dL    Creatinine, Ser 9.14  0.50 - 1.10 mg/dL  Calcium 10.1  8.4 - 10.5 mg/dL    GFR calc non Af Amer 45 (*) >90 mL/min    GFR calc Af Amer 53 (*) >90 mL/min   TYPE AND SCREEN     Status: Normal   Collection Time   07/13/12  7:42 PM      Component Value Range Comment   ABO/RH(D) A POS      Antibody Screen NEG      Sample Expiration 07/16/2012      Dg Chest 1 View  07/13/2012  *RADIOLOGY REPORT*  Clinical Data: Left intertrochanteric fracture following a fall this morning.  CHEST - 1 VIEW  Comparison: 01/14/2011.  Findings: Enlarged cardiac silhouette without significant change. Stable left subclavian pacemaker leads.  Clear lungs.  Diffuse osteopenia.  No fracture pneumothorax seen.  IMPRESSION: No acute abnormality.  Stable cardiomegaly.  Original Report Authenticated By: Darrol Angel, M.D.   Dg Pelvis 1-2 Views  07/13/2012  *RADIOLOGY REPORT*  Clinical Data: Left hip and proximal leg pain following a fall today.  PELVIS - 1-2 VIEW  Comparison: None.  Findings: Left intertrochanteric fracture with varus angulation. Diffuse osteopenia.  IMPRESSION: Left intertrochanteric fracture with varus angulation.  Original Report Authenticated By: Darrol Angel, M.D.   Dg Femur Left  07/13/2012  *RADIOLOGY REPORT*  Clinical Data: Left hip and proximal leg pain following a fall today.  LEFT FEMUR - 2 VIEW  Comparison: Pelvis radiograph obtained at the same time.  Findings:  Previously described left intertrochanteric fracture with varus angulation.  No additional fractures are seen and no dislocations are demonstrated.  Diffuse osteopenia.  Mild atheromatous arterial calcifications.  IMPRESSION: Left intertrochanteric fracture with varus angulation.  Original Report Authenticated By: Darrol Angel, M.D.    Review of Systems  Constitutional: Negative.   HENT: Negative.   Eyes: Negative.   Respiratory: Negative.   Cardiovascular:       History of pacemaker for heart block.  History of hyperlipidemia  Genitourinary: Negative.   Musculoskeletal: Positive for falls (Fall at nursing home tonight.  Pain left hip.  Also history of gout.).  Skin: Negative.   Neurological: Negative.   Endo/Heme/Allergies:       History of thrombocytopenia  Psychiatric/Behavioral: Negative.     Blood pressure 165/78, pulse 73, temperature 98.1 F (36.7 C), temperature source Oral, resp. rate 14, height 5\' 3"  (1.6 m), weight 54.432 kg (120 lb), SpO2 97.00%. Physical Exam  Constitutional: She is oriented to person, place, and time. She appears well-developed and well-nourished.  HENT:  Head: Normocephalic.  Eyes: Conjunctivae and EOM are normal.  Neck: Normal range of motion. Neck supple.  Cardiovascular: Normal rate, regular rhythm, normal heart sounds and intact distal pulses.        Pacemaker  Respiratory: Effort normal and breath sounds normal.  GI: Soft.  Musculoskeletal: She exhibits tenderness (Pain with motion of the left hip.  shortening.  Pain.).       Left hip: She exhibits decreased range of motion, tenderness, crepitus and deformity.       Legs: Neurological: She is alert and oriented to person, place, and time. She has normal reflexes.  Skin: Skin is warm and dry.  Psychiatric: She has a normal mood and affect. Her behavior is normal. Judgment and thought content normal.     Assessment/Plan Left hip fracture, intertrochanteric.  History of pacemaker.  She  will need surgery of the left hip.  I have discussed risks and imponderables of infection, embolus, need  for anti-coagulation, physical therapy and possible blood transfusion.  I would recommend a spinal anesthetic.    I will be leaving town Wednesday morning, July 31st.  Dr. Romeo Apple will not be back in town until the following day.  There will be a gap in orthopaedic coverage until he returns.  The patient and internist know this, agree to this and accept this.  I plan to do surgery tomorrow.  If there is a delay because of medical reasons, she may need to be transferred to [facility].  Taneisha Fuson 07/13/2012, 9:55 PM

## 2012-07-14 ENCOUNTER — Encounter (HOSPITAL_COMMUNITY): Payer: Self-pay | Admitting: *Deleted

## 2012-07-14 ENCOUNTER — Inpatient Hospital Stay (HOSPITAL_COMMUNITY): Payer: Medicare Other | Admitting: Anesthesiology

## 2012-07-14 ENCOUNTER — Encounter (HOSPITAL_COMMUNITY): Payer: Self-pay | Admitting: Anesthesiology

## 2012-07-14 ENCOUNTER — Inpatient Hospital Stay (HOSPITAL_COMMUNITY): Payer: Medicare Other

## 2012-07-14 ENCOUNTER — Encounter (HOSPITAL_COMMUNITY)
Admission: EM | Disposition: A | Discharge status: Skilled Nursing Facility | Payer: Self-pay | Source: Home / Self Care | Attending: Internal Medicine

## 2012-07-14 DIAGNOSIS — S72002A Fracture of unspecified part of neck of left femur, initial encounter for closed fracture: Secondary | ICD-10-CM | POA: Diagnosis present

## 2012-07-14 HISTORY — PX: ORIF HIP FRACTURE: SHX2125

## 2012-07-14 LAB — CBC
HCT: 32.4 % — ABNORMAL LOW (ref 36.0–46.0)
Hemoglobin: 10.3 g/dL — ABNORMAL LOW (ref 12.0–15.0)
MCH: 29.4 pg (ref 26.0–34.0)
MCHC: 31.8 g/dL (ref 30.0–36.0)
MCV: 92.6 fL (ref 78.0–100.0)
RDW: 14.4 % (ref 11.5–15.5)

## 2012-07-14 LAB — CBC WITH DIFFERENTIAL/PLATELET
Basophils Relative: 0 % (ref 0–1)
Eosinophils Absolute: 0.2 10*3/uL (ref 0.0–0.7)
HCT: 32.4 % — ABNORMAL LOW (ref 36.0–46.0)
Hemoglobin: 10.7 g/dL — ABNORMAL LOW (ref 12.0–15.0)
MCH: 30.1 pg (ref 26.0–34.0)
MCHC: 33 g/dL (ref 30.0–36.0)
Monocytes Absolute: 1.1 10*3/uL — ABNORMAL HIGH (ref 0.1–1.0)
Monocytes Relative: 11 % (ref 3–12)
Neutrophils Relative %: 58 % (ref 43–77)

## 2012-07-14 SURGERY — OPEN REDUCTION INTERNAL FIXATION HIP
Anesthesia: Spinal | Site: Hip | Laterality: Left | Wound class: Clean

## 2012-07-14 MED ORDER — ALBUTEROL SULFATE (5 MG/ML) 0.5% IN NEBU
2.5000 mg | INHALATION_SOLUTION | Freq: Four times a day (QID) | RESPIRATORY_TRACT | Status: DC
  Administered 2012-07-14 – 2012-07-16 (×8): 2.5 mg via RESPIRATORY_TRACT
  Filled 2012-07-14 (×8): qty 0.5

## 2012-07-14 MED ORDER — MEMANTINE HCL 10 MG PO TABS: 10.0000 mg | ORAL_TABLET | Freq: Two times a day (BID) | ORAL | Status: DC

## 2012-07-14 MED ORDER — SIMVASTATIN 10 MG PO TABS
10.0000 mg | ORAL_TABLET | Freq: Every day | ORAL | Status: DC
  Administered 2012-07-14 – 2012-07-16 (×3): 10 mg via ORAL
  Filled 2012-07-14 (×3): qty 1

## 2012-07-14 MED ORDER — CITALOPRAM HYDROBROMIDE 20 MG PO TABS: 10.0000 mg | ORAL_TABLET | Freq: Every day | ORAL | Status: DC

## 2012-07-14 MED ORDER — BUPIVACAINE IN DEXTROSE 0.75-8.25 % IT SOLN
INTRATHECAL | Status: AC
  Filled 2012-07-14: qty 2

## 2012-07-14 MED ORDER — MIDAZOLAM HCL 2 MG/2ML IJ SOLN
INTRAMUSCULAR | Status: AC
  Filled 2012-07-14: qty 2

## 2012-07-14 MED ORDER — MUPIROCIN 2 % EX OINT
1.0000 "application " | TOPICAL_OINTMENT | Freq: Two times a day (BID) | CUTANEOUS | Status: DC
  Administered 2012-07-14 – 2012-07-17 (×7): 1 via NASAL
  Filled 2012-07-14 (×2): qty 22

## 2012-07-14 MED ORDER — ACETAMINOPHEN 10 MG/ML IV SOLN
INTRAVENOUS | Status: AC
  Filled 2012-07-14: qty 200

## 2012-07-14 MED ORDER — ENOXAPARIN SODIUM 30 MG/0.3ML ~~LOC~~ SOLN
30.0000 mg | SUBCUTANEOUS | Status: DC
  Administered 2012-07-15 – 2012-07-17 (×3): 30 mg via SUBCUTANEOUS
  Filled 2012-07-14 (×3): qty 0.3

## 2012-07-14 MED ORDER — ALBUTEROL SULFATE (5 MG/ML) 0.5% IN NEBU
2.5000 mg | INHALATION_SOLUTION | Freq: Four times a day (QID) | RESPIRATORY_TRACT | Status: DC
  Administered 2012-07-14: 2.5 mg via RESPIRATORY_TRACT
  Filled 2012-07-14: qty 0.5

## 2012-07-14 MED ORDER — HYDROGEN PEROXIDE 3 % EX SOLN
CUTANEOUS | Status: DC | PRN
  Administered 2012-07-14: 1

## 2012-07-14 MED ORDER — CHLORHEXIDINE GLUCONATE CLOTH 2 % EX PADS
6.0000 | MEDICATED_PAD | Freq: Every day | CUTANEOUS | Status: DC
  Administered 2012-07-14 – 2012-07-17 (×4): 6 via TOPICAL

## 2012-07-14 MED ORDER — FENTANYL CITRATE 0.05 MG/ML IJ SOLN
INTRAMUSCULAR | Status: AC
  Filled 2012-07-14: qty 2

## 2012-07-14 MED ORDER — PROPOFOL 10 MG/ML IV EMUL
INTRAVENOUS | Status: AC
  Filled 2012-07-14: qty 20

## 2012-07-14 MED ORDER — LIDOCAINE HCL (PF) 1 % IJ SOLN
INTRAMUSCULAR | Status: AC
  Filled 2012-07-14: qty 5

## 2012-07-14 MED ORDER — POTASSIUM CHLORIDE CRYS ER 20 MEQ PO TBCR
20.0000 meq | EXTENDED_RELEASE_TABLET | Freq: Every day | ORAL | Status: DC
  Administered 2012-07-14 – 2012-07-17 (×4): 20 meq via ORAL
  Filled 2012-07-14 (×4): qty 1

## 2012-07-14 MED ORDER — METOPROLOL TARTRATE 25 MG PO TABS: 25.0000 mg | ORAL_TABLET | Freq: Two times a day (BID) | ORAL | Status: DC

## 2012-07-14 MED ORDER — ACETAMINOPHEN 325 MG PO TABS: 650.0000 mg | ORAL_TABLET | Freq: Four times a day (QID) | ORAL | Status: DC | PRN

## 2012-07-14 MED ORDER — BUPIVACAINE HCL 0.75 % IJ SOLN
INTRAMUSCULAR | Status: DC | PRN
Start: 2012-07-14 — End: 2012-07-14
  Administered 2012-07-14: 13 mg

## 2012-07-14 MED ORDER — DICLOFENAC SODIUM 25 MG PO TBEC: 25.0000 mg | DELAYED_RELEASE_TABLET | Freq: Two times a day (BID) | ORAL | Status: DC

## 2012-07-14 MED ORDER — PHENYLEPHRINE HCL 10 MG/ML IJ SOLN
INTRAMUSCULAR | Status: AC
  Filled 2012-07-14: qty 1

## 2012-07-14 MED ORDER — PHENYLEPHRINE HCL 10 MG/ML IJ SOLN
INTRAMUSCULAR | Status: DC | PRN
  Administered 2012-07-14 (×5): 100 ug via INTRAVENOUS

## 2012-07-14 MED ORDER — FENTANYL CITRATE 0.05 MG/ML IJ SOLN: 25.0000 ug | INTRAMUSCULAR | Status: DC | PRN

## 2012-07-14 MED ORDER — PROMETHAZINE HCL 25 MG/ML IJ SOLN: 12.5000 mg | INTRAMUSCULAR | Status: DC | PRN

## 2012-07-14 MED ORDER — SODIUM CHLORIDE 0.9 % IR SOLN
Status: DC | PRN
  Administered 2012-07-14: 1000 mL

## 2012-07-14 MED ORDER — FENTANYL CITRATE 0.05 MG/ML IJ SOLN
INTRAMUSCULAR | Status: DC | PRN
  Administered 2012-07-14: 20 ug via INTRATHECAL
  Administered 2012-07-14: 25 ug via INTRAVENOUS

## 2012-07-14 MED ORDER — ACETAMINOPHEN 10 MG/ML IV SOLN
1000.0000 mg | Freq: Four times a day (QID) | INTRAVENOUS | Status: AC
  Administered 2012-07-14 – 2012-07-15 (×4): 1000 mg via INTRAVENOUS
  Filled 2012-07-14 (×5): qty 100

## 2012-07-14 MED ORDER — DICLOFENAC SODIUM 25 MG PO TBEC
25.0000 mg | DELAYED_RELEASE_TABLET | Freq: Two times a day (BID) | ORAL | Status: DC
  Filled 2012-07-14 (×3): qty 1

## 2012-07-14 MED ORDER — PROPOFOL 10 MG/ML IV EMUL
INTRAVENOUS | Status: DC | PRN
  Administered 2012-07-14: 10 ug/kg/min via INTRAVENOUS

## 2012-07-14 MED ORDER — FUROSEMIDE 20 MG PO TABS
20.0000 mg | ORAL_TABLET | ORAL | Status: DC
  Administered 2012-07-14 – 2012-07-16 (×2): 20 mg via ORAL
  Filled 2012-07-14 (×2): qty 1

## 2012-07-14 MED ORDER — CHLORHEXIDINE GLUCONATE CLOTH 2 % EX PADS: 6.0000 | MEDICATED_PAD | Freq: Every day | CUTANEOUS | Status: DC

## 2012-07-14 MED ORDER — CEFAZOLIN SODIUM-DEXTROSE 2-3 GM-% IV SOLR
INTRAVENOUS | Status: AC
  Filled 2012-07-14: qty 50

## 2012-07-14 MED ORDER — MIDAZOLAM HCL 2 MG/2ML IJ SOLN
1.0000 mg | INTRAMUSCULAR | Status: DC | PRN
  Administered 2012-07-14: 1 mg via INTRAVENOUS

## 2012-07-14 MED ORDER — LACTATED RINGERS IV SOLN
INTRAVENOUS | Status: DC
  Administered 2012-07-14: 10:00:00 via INTRAVENOUS
  Administered 2012-07-14: 1000 mL via INTRAVENOUS

## 2012-07-14 MED ORDER — MUPIROCIN 2 % EX OINT: 1.0000 "application " | TOPICAL_OINTMENT | Freq: Two times a day (BID) | CUTANEOUS | Status: DC

## 2012-07-14 MED ORDER — ONDANSETRON HCL 4 MG/2ML IJ SOLN: 4.0000 mg | Freq: Once | INTRAMUSCULAR | Status: DC | PRN

## 2012-07-14 SURGICAL SUPPLY — 52 items
BAG HAMPER (MISCELLANEOUS) ×2 IMPLANT
BIT DRILL TWIST 3.5MM (BIT) ×1 IMPLANT
BLADE SURG SZ10 CARB STEEL (BLADE) ×4 IMPLANT
BLADE SURG SZ20 CARB STEEL (BLADE) ×2 IMPLANT
CLEANER TIP ELECTROSURG 2X2 (MISCELLANEOUS) ×1 IMPLANT
CLOTH BEACON ORANGE TIMEOUT ST (SAFETY) ×2 IMPLANT
COVER LIGHT HANDLE STERIS (MISCELLANEOUS) ×4 IMPLANT
COVER MAYO STAND XLG (DRAPE) ×2 IMPLANT
DRAPE STERI IOBAN 125X83 (DRAPES) ×2 IMPLANT
DRILL TWIST 3.5MM (BIT) ×2
ELECT REM PT RETURN 9FT ADLT (ELECTROSURGICAL) ×2
ELECTRODE REM PT RTRN 9FT ADLT (ELECTROSURGICAL) ×1 IMPLANT
EVACUATOR 3/16  PVC DRAIN (DRAIN) ×1
EVACUATOR 3/16 PVC DRAIN (DRAIN) ×1 IMPLANT
GAUZE XEROFORM 5X9 LF (GAUZE/BANDAGES/DRESSINGS) ×2 IMPLANT
GLOVE BIO SURGEON STRL SZ8 (GLOVE) ×2 IMPLANT
GLOVE BIO SURGEON STRL SZ8.5 (GLOVE) ×2 IMPLANT
GLOVE BIOGEL PI IND STRL 7.0 (GLOVE) IMPLANT
GLOVE BIOGEL PI INDICATOR 7.0 (GLOVE) ×3
GLOVE EXAM NITRILE MD LF STRL (GLOVE) ×1 IMPLANT
GLOVE SS BIOGEL STRL SZ 6.5 (GLOVE) IMPLANT
GLOVE SUPERSENSE BIOGEL SZ 6.5 (GLOVE) ×3
GOWN STRL REIN XL XLG (GOWN DISPOSABLE) ×7 IMPLANT
GUIDE PIN CALIBRATED (PIN) ×2 IMPLANT
GUIDE PIN CALIBRATED 2.4X23 (PIN) ×1 IMPLANT
INST SET MAJOR BONE (KITS) ×2 IMPLANT
KIT BLADEGUARD II DBL (SET/KITS/TRAYS/PACK) ×2 IMPLANT
KIT ROOM TURNOVER AP CYSTO (KITS) ×2 IMPLANT
MANIFOLD NEPTUNE II (INSTRUMENTS) ×2 IMPLANT
MARKER SKIN DUAL TIP RULER LAB (MISCELLANEOUS) ×2 IMPLANT
NS IRRIG 1000ML POUR BTL (IV SOLUTION) ×2 IMPLANT
PACK BASIC III (CUSTOM PROCEDURE TRAY) ×2
PACK SRG BSC III STRL LF ECLPS (CUSTOM PROCEDURE TRAY) ×1 IMPLANT
PAD ABD 5X9 TENDERSORB (GAUZE/BANDAGES/DRESSINGS) ×2 IMPLANT
PAD ARMBOARD 7.5X6 YLW CONV (MISCELLANEOUS) ×2 IMPLANT
PENCIL HANDSWITCHING (ELECTRODE) ×2 IMPLANT
PLATE SHORT BARRELL 140X4 (Plate) ×1 IMPLANT
SCREW CORTICAL SFTP 4.5X38MM (Screw) ×2 IMPLANT
SCREW CORTICAL SFTP 4.5X40MM (Screw) ×2 IMPLANT
SCREW CORTICAL SFTP 4.5X42MM (Screw) ×1 IMPLANT
SCREW LAG 85MM (Screw) ×2 IMPLANT
SCREW LAGSTD 85X21X12.7X9 (Screw) ×1 IMPLANT
SET BASIN LINEN APH (SET/KITS/TRAYS/PACK) ×2 IMPLANT
SPONGE GAUZE 4X4 12PLY (GAUZE/BANDAGES/DRESSINGS) ×2 IMPLANT
SPONGE LAP 18X18 X RAY DECT (DISPOSABLE) ×4 IMPLANT
STAPLER VISISTAT 35W (STAPLE) ×2 IMPLANT
SUT BRALON NAB BRD #1 30IN (SUTURE) ×4 IMPLANT
SUT PLAIN 2 0 XLH (SUTURE) ×2 IMPLANT
SUT SILK 0 FSL (SUTURE) ×2 IMPLANT
SYR BULB IRRIGATION 50ML (SYRINGE) ×2 IMPLANT
TAPE MEDIFIX FOAM 3 (GAUZE/BANDAGES/DRESSINGS) ×2 IMPLANT
YANKAUER SUCT 12FT TUBE ARGYLE (SUCTIONS) ×2 IMPLANT

## 2012-07-14 NOTE — Anesthesia Procedure Notes (Addendum)
Spinal  Patient location during procedure: OR Start time: 07/14/2012 9:18 AM Staffing CRNA/Resident: Glynn Octave E Preanesthetic Checklist Completed: patient identified, site marked, surgical consent, pre-op evaluation, timeout performed, IV checked, risks and benefits discussed and monitors and equipment checked Spinal Block Patient position: left lateral decubitus Prep: Betadine Patient monitoring: heart rate, cardiac monitor, continuous pulse ox and blood pressure Approach: left paramedian Location: L3-4 Injection technique: single-shot Needle Needle type: Spinocan  Needle gauge: 22 G Needle length: 9 cm Assessment Sensory level: T8 Additional Notes  ATTEMPTS:2 TRAY [id number] TRAY EXPIRATION DATE:05/2013

## 2012-07-14 NOTE — Progress Notes (Signed)
Subjective: Patient complains of Lt hip pain. She was admitted from [facility] nursing [facility] due to lt hip fracture.Patient is scheduled for surgery today.   Objective: Vital signs in last 24 hours: Temp:  [97.9 F (36.6 C)-99 F (37.2 C)] 97.9 F (36.6 C) (07/30 0623) Pulse Rate:  [73-81] 73  (07/30 0623) Resp:  [14-18] 16  (07/30 0623) BP: (122-165)/(70-78) 154/75 mmHg (07/30 0623) SpO2:  [94 %-98 %] 98 % (07/30 0623) Weight:  [53.5 kg (117 lb 15.1 oz)-54.432 kg (120 lb)] 53.5 kg (117 lb 15.1 oz) (07/30 1610) Weight change:  Last BM Date: 07/13/12  Intake/Output from previous day: 07/29 0701 - 07/30 0700 In: -  Out: 350 [Urine:350]  PHYSICAL EXAM General appearance: alert and no distress Resp: clear to auscultation bilaterally Cardio: S1, S2 normal GI: soft, non-tender; bowel sounds normal; no masses,  no organomegaly Extremities: tenderness of the lt hip area  Lab Results:    @labtest @ [facility] No results found for this basename: PHART,PCO2,PO2ART,TCO2,HCO3 in the last 72 hours CULTURES Recent Results (from the past 240 hour(s))  SURGICAL PCR SCREEN     Status: Abnormal   Collection Time   07/14/12 12:15 AM      Component Value Range Status Comment   MRSA, PCR POSITIVE (*) NEGATIVE Final    Staphylococcus aureus POSITIVE (*) NEGATIVE Final    Studies/Results: Dg Chest 1 View  07/13/2012  *RADIOLOGY REPORT*  Clinical Data: Left intertrochanteric fracture following a fall this morning.  CHEST - 1 VIEW  Comparison: 01/14/2011.  Findings: Enlarged cardiac silhouette without significant change. Stable left subclavian pacemaker leads.  Clear lungs.  Diffuse osteopenia.  No fracture pneumothorax seen.  IMPRESSION: No acute abnormality.  Stable cardiomegaly.  Original Report Authenticated By: Darrol Angel, M.D.   Dg Pelvis 1-2 Views  07/13/2012  *RADIOLOGY REPORT*  Clinical Data: Left hip and proximal leg pain following a fall today.  PELVIS - 1-2 VIEW  Comparison: None.   Findings: Left intertrochanteric fracture with varus angulation. Diffuse osteopenia.  IMPRESSION: Left intertrochanteric fracture with varus angulation.  Original Report Authenticated By: Darrol Angel, M.D.   Dg Femur Left  07/13/2012  *RADIOLOGY REPORT*  Clinical Data: Left hip and proximal leg pain following a fall today.  LEFT FEMUR - 2 VIEW  Comparison: Pelvis radiograph obtained at the same time.  Findings: Previously described left intertrochanteric fracture with varus angulation.  No additional fractures are seen and no dislocations are demonstrated.  Diffuse osteopenia.  Mild atheromatous arterial calcifications.  IMPRESSION: Left intertrochanteric fracture with varus angulation.  Original Report Authenticated By: Darrol Angel, M.D.    Medications: I have reviewed the patient's current medications.  Assesment: 1. Hip fracture 2. H/O PUD 3.Dementia 4. S/P pacemaker insertion Active Problems:  Hip fracture, left    Plan: Medications reviewed. Surgery as per orthopedics plan.    LOS: 1 day   Prudence Heiny 07/14/2012, 7:51 AM

## 2012-07-14 NOTE — Anesthesia Preprocedure Evaluation (Signed)
Anesthesia Evaluation  Patient identified by MRN, date of birth, ID band Patient confused    Reviewed: Allergy & Precautions, H&P , NPO status , Patient's Chart, lab work & pertinent test results, Unable to perform ROS - Chart review only  Airway Mallampati: II      Dental  (+) Edentulous Upper and Edentulous Lower   Pulmonary  breath sounds clear to auscultation        Cardiovascular hypertension, Pt. on medications + CAD + dysrhythmias + pacemaker Rhythm:Regular Rate:Normal     Neuro/Psych PSYCHIATRIC DISORDERS (Alzheimers dementia)    GI/Hepatic PUD,   Endo/Other    Renal/GU      Musculoskeletal   Abdominal   Peds  Hematology   Anesthesia Other Findings   Reproductive/Obstetrics                           Anesthesia Physical Anesthesia Plan  ASA: III  Anesthesia Plan: Spinal   Post-op Pain Management:    Induction:   Airway Management Planned: Nasal Cannula  Additional Equipment:   Intra-op Plan:   Post-operative Plan:   Informed Consent: I have reviewed the patients History and Physical, chart, labs and discussed the procedure including the risks, benefits and alternatives for the proposed anesthesia with the patient or authorized representative who has indicated his/her understanding and acceptance.     Plan Discussed with:   Anesthesia Plan Comments:         Anesthesia Quick Evaluation

## 2012-07-14 NOTE — Brief Op Note (Signed)
07/13/2012 - 07/14/2012  10:19 AM  PATIENT:  [name]  78 y.o. female  PRE-OPERATIVE DIAGNOSIS:  Left intertrochenteric fracture  POST-OPERATIVE DIAGNOSIS:  Left intertrochenteric fracture  PROCEDURE:  Procedure(s) (LRB): OPEN REDUCTION INTERNAL FIXATION HIP (Left)  SURGEON:  Surgeon(s) and Role:    * Darreld Mclean, MD - Primary  PHYSICIAN ASSISTANT:   ASSISTANTS: none   ANESTHESIA:   spinal  EBL:  Total I/O In: 1000 [I.V.:1000] Out: 300 [Urine:200; Blood:100]  BLOOD ADMINISTERED:none  DRAINS: (large) Hemovact drain(s) in the left hip with  Suction Open   LOCAL MEDICATIONS USED:  NONE  SPECIMEN:  No Specimen  DISPOSITION OF SPECIMEN:  N/A  COUNTS:  YES  TOURNIQUET:  * No tourniquets in log *  DICTATION: .Other Dictation: Dictation Number [id number]  PLAN OF CARE: Admit to inpatient   PATIENT DISPOSITION:  PACU - hemodynamically stable.   Delay start of Pharmacological VTE agent (>24hrs) due to surgical blood loss or risk of bleeding: no

## 2012-07-14 NOTE — Anesthesia Postprocedure Evaluation (Addendum)
  Anesthesia Post-op Note  Patient: [name]  Procedure(s) Performed: Procedure(s) (LRB): OPEN REDUCTION INTERNAL FIXATION HIP (Left)  Patient Location: PACU  Anesthesia Type: Spinal  Level of Consciousness: awake and alert   Airway and Oxygen Therapy: Patient Spontanous Breathing and Patient connected to face mask oxygen  Post-op Pain: none  Post-op Assessment: Post-op Vital signs reviewed, Patient's Cardiovascular Status Stable, Respiratory Function Stable, Patent Airway and No signs of Nausea or vomiting  Post-op Vital Signs: Reviewed  Complications: No apparent anesthesia complications 07/15/12  Physical therapy working with patient.  Mental status at baseline, no apparent anesthesia complications.

## 2012-07-14 NOTE — Brief Op Note (Signed)
Beta blocker not given prior to surgery   Dr Marcos Eke aware

## 2012-07-14 NOTE — Clinical Social Work Psychosocial (Signed)
Clinical Social Work Department BRIEF PSYCHOSOCIAL ASSESSMENT 07/14/2012  Patient:  [name], [name]     Account Number:  [id number]     Admit date:  07/13/2012  Clinical Social Worker:  Nancie Neas  Date/Time:  07/14/2012 11:30 AM  Referred by:  RN  Date Referred:  07/14/2012 Referred for  SNF Placement   Other Referral:   Interview type:  Family Other interview type:   Brother- [name]  Sister- [name]    PSYCHOSOCIAL DATA Living Status:  FACILITY Admitted from facility:  [facility] Level of care:  Skilled Nursing Facility Primary support name:  [name] Primary support relationship to patient:  [place] Degree of support available:   supportive    CURRENT CONCERNS Current Concerns  Post-Acute Placement   Other Concerns:    SOCIAL WORK ASSESSMENT / PLAN Pt admitted due to hip fracture following a fall at [facility] yesterday. Pt is currently in surgery. CSW spoke to pt's brother [name] and sister [name] who appear to be involved and supportive. They state that pt has multiple siblings who visit at least daily. Pt has no children. Per [name] at facility, pt is long term resident and private pay. She rooms with her sister there. Okay for return according to [place].   Assessment/plan status:  Psychosocial Support/Ongoing Assessment of Needs Other assessment/ plan:   Information/referral to community resources:   [facility]    PATIENT'S/FAMILY'S RESPONSE TO PLAN OF CARE: Pt unable to discuss plan of care at this time. Family report that pt and her sister do everything together at [facility] and are pleased with her care. They request return to [facility] when medically stable.        [name], [place] [phone]

## 2012-07-14 NOTE — Progress Notes (Signed)
UR Chart Review Completed  

## 2012-07-14 NOTE — Care Management Note (Signed)
    Page 1 of 1   07/17/2012     10:18:34 AM   CARE MANAGEMENT NOTE 07/17/2012  Patient:  [name], [name]   Account Number:  [id number]  Date Initiated:  07/14/2012  Documentation initiated by:  Sharrie Rothman  Subjective/Objective Assessment:   Pt admitted from [facility] with hip fracture. Pt to undergo surgery on 07/15/12. Pt will return to [facility] at discharge.     Action/Plan:   CSW is aware and will arrange discharge to facility when pt is medically stable.   Anticipated DC Date:  07/17/2012   Anticipated DC Plan:  SKILLED NURSING FACILITY  In-house referral  Clinical Social Worker      DC Planning Services  CM consult      Choice offered to / List presented to:             Status of service:  Completed, signed off Medicare Important Message given?   (If response is "NO", the following Medicare IM given date fields will be blank) Date Medicare IM given:   Date Additional Medicare IM given:    Discharge Disposition:  SKILLED NURSING FACILITY  Per UR Regulation:    If discussed at Long Length of Stay Meetings, dates discussed:    Comments:  07/17/12 1017 Arlyss Queen, RN BSN CM Pt discharged to [facility] today. CSW will make discharge arrangements.  07/14/12 1315 Arlyss Queen, RN BSN CM

## 2012-07-14 NOTE — H&P (Signed)
NAMEREMELL, GIAIMO              ACCOUNT NO.:  [id number]  MEDICAL RECORD NO.:  [id number]  LOCATION:                                 FACILITY:  PHYSICIAN:  Kingsley Callander. Ouida Sills, MD       DATE OF BIRTH:  [birth year]  DATE OF ADMISSION:  07/13/2012 DATE OF DISCHARGE:  [facility]                             HISTORY & PHYSICAL   CHIEF COMPLAINT:  Cannot walk.  HISTORY OF PRESENT ILLNESS:  This patient is an 78 year old, white female, resident of [facility] and patient of Dr. Felecia Shelling who presented to the emergency room after she was unable to walk and was continuing to complain of left leg pain.  There was suspicion that the patient may have fallen last week.  Although this was evidently not witnessed.  She had an x-ray of her left leg last week which reportedly showed no fracture.  She was sent to the emergency room on the day of admission because of inability to walk and continued pain and was found to have an intertrochanteric fracture of the left hip.  She has a history of osteoporosis.  PAST MEDICAL HISTORY: 1. Complete heart block with pacemaker placement. 2. Peptic ulcer disease with a perforation in 2012, repaired by Dr.     Lovell Sheehan. 3. Alzheimer disease. 4. Hypertension. 5. Appendectomy.  MEDICATIONS:  Namenda 10 mg b.i.d., Celexa 10 mg daily, Voltaren 50 mg b.i.d., Colace 100 mg b.i.d., Lasix 20 mg every other day, potassium 20 mEq daily, Lopressor 25 mg b.i.d., Prilosec 20 mg daily, Senokot daily, Simvastatin 10 mg daily, and tramadol p.r.n.  ALLERGIES:  None known.  SOCIAL HISTORY:  She has not been a smoker or a drinker.  She is accompanied by her sister.  FAMILY HISTORY:  Sisters had 2 MIs.  A sister has Alzheimer disease. Her sister has had rectal cancer.  Both parents had strokes.  REVIEW OF SYSTEMS:  Noncontributory.  PHYSICAL EXAMINATION:  VITAL SIGNS:  Temperature 99, pulse 81, respirations 18, and blood pressure 165/78. GENERAL:  She is sedated but comfortable appearing  at the time of my exam. HEENT:  Eyes, nose, and oropharynx are unremarkable. NECK:  No JVD or thyromegaly. LUNGS:  Clear. HEART:  Regular with grade 2 systolic murmur.  She has a pacemaker in her left upper chest. ABDOMEN:  Soft and nontender with no hepatosplenomegaly. EXTREMITIES:  Reveal tenderness in the left upper leg and hip region. Distal pulses are 2+.  No edema is present. NEUROLOGICAL:  She has no weakness in the upper extremities or in the right lower extremity. LYMPH NODES:  No cervical or supraclavicular enlargement.  LABORATORY DATA:  White count 9.2, hemoglobin 11.4, platelets 148,000, sodium 139, potassium 4, bicarb 30, BUN 39, creatinine 1.06, calcium 10.1, and glucose 152.  Chest x-ray reveals no infiltrate.  EKG reveals a paced ventricular rhythm at 81 beats per minute.  IMPRESSION/PLAN: 1. Left hip fracture.  There appeared to be no absolute     contraindications to surgical repair.  She will be seen in     consultation by Dr. Hilda Lias. 2. Alzheimer disease.  Continue Namenda. 3. Probable pre renal azotemia.  Lasix, potassium, and diclofenac will  be held.  She will be hydrated overnight prior to surgery. 4. History of complete heart block and pacemaker placement.  She has     been followed regularly by Dr. Ladona Ridgel. 5. History of peptic ulcer disease, continue omeprazole.     Kingsley Callander. Ouida Sills, MD     ROF/MEDQ  D:  07/14/2012  T:  07/14/2012  Job:  [id number]

## 2012-07-14 NOTE — Transfer of Care (Signed)
Immediate Anesthesia Transfer of Care Note  Patient: [name]  Procedure(s) Performed: Procedure(s) (LRB): OPEN REDUCTION INTERNAL FIXATION HIP (Left)  Patient Location: PACU  Anesthesia Type: Spinal  Level of Consciousness: awake and alert   Airway & Oxygen Therapy: Patient Spontanous Breathing and Patient connected to face mask oxygen  Post-op Assessment: Report given to PACU RN  Post vital signs: Reviewed  Complications: No apparent anesthesia complications, BP borderline low, monitoring, and giving another 400cc NS

## 2012-07-15 LAB — CBC WITH DIFFERENTIAL/PLATELET
Basophils Absolute: 0 10*3/uL (ref 0.0–0.1)
Basophils Relative: 0 % (ref 0–1)
Eosinophils Absolute: 0.1 10*3/uL (ref 0.0–0.7)
Hemoglobin: 9 g/dL — ABNORMAL LOW (ref 12.0–15.0)
MCH: 29.7 pg (ref 26.0–34.0)
MCHC: 32.3 g/dL (ref 30.0–36.0)
Monocytes Relative: 16 % — ABNORMAL HIGH (ref 3–12)
Neutro Abs: 6.4 10*3/uL (ref 1.7–7.7)
Neutrophils Relative %: 63 % (ref 43–77)
RDW: 14.5 % (ref 11.5–15.5)

## 2012-07-15 LAB — BASIC METABOLIC PANEL
BUN: 21 mg/dL (ref 6–23)
Calcium: 8.9 mg/dL (ref 8.4–10.5)
Creatinine, Ser: 0.97 mg/dL (ref 0.50–1.10)
GFR calc Af Amer: 59 mL/min — ABNORMAL LOW (ref >90)
GFR calc non Af Amer: 51 mL/min — ABNORMAL LOW (ref >90)
Glucose, Bld: 103 mg/dL — ABNORMAL HIGH (ref 70–99)
Potassium: 4 mEq/L (ref 3.5–5.1)

## 2012-07-15 LAB — URINE CULTURE: Colony Count: NO GROWTH

## 2012-07-15 NOTE — Progress Notes (Signed)
Subjective: Patient complains of Lt hip pain. She had rt hip surgery yesteray.  Objective: Vital signs in last 24 hours: Temp:  [97.5 F (36.4 C)-98.7 F (37.1 C)] 97.5 F (36.4 C) (07/31 0537) Pulse Rate:  [58-94] 71  (07/31 0537) Resp:  [14-71] 20  (07/31 0537) BP: (89-158)/(44-67) 133/63 mmHg (07/31 0537) SpO2:  [77 %-100 %] 100 % (07/31 0537) Weight:  [60.5 kg (133 lb 6.1 oz)-61 kg (134 lb 7.7 oz)] 61 kg (134 lb 7.7 oz) (07/31 0447) Weight change: 6.068 kg (13 lb 6.1 oz) Last BM Date: 07/13/12  Intake/Output from previous day: 07/30 0701 - 07/31 0700 In: 3545 [I.V.:3245; IV Piggyback:300] Out: 1950 [Urine:1850; Blood:100]  PHYSICAL EXAM General appearance: alert and no distress Resp: clear to auscultation bilaterally Cardio: S1, S2 normal GI: soft, non-tender; bowel sounds normal; no masses,  no organomegaly Extremities:  rt hip surgical site dressed. Jp drain in place  Lab Results:    @labtest @ [facility] No results found for this basename: PHART,PCO2,PO2ART,TCO2,HCO3 in the last 72 hours CULTURES Recent Results (from the past 240 hour(s))  SURGICAL PCR SCREEN     Status: Abnormal   Collection Time   07/14/12 12:15 AM      Component Value Range Status Comment   MRSA, PCR POSITIVE (*) NEGATIVE Final    Staphylococcus aureus POSITIVE (*) NEGATIVE Final    Studies/Results: Dg Chest 1 View  07/13/2012  *RADIOLOGY REPORT*  Clinical Data: Left intertrochanteric fracture following a fall this morning.  CHEST - 1 VIEW  Comparison: 01/14/2011.  Findings: Enlarged cardiac silhouette without significant change. Stable left subclavian pacemaker leads.  Clear lungs.  Diffuse osteopenia.  No fracture pneumothorax seen.  IMPRESSION: No acute abnormality.  Stable cardiomegaly.  Original Report Authenticated By: Darrol Angel, M.D.   Dg Pelvis 1-2 Views  07/13/2012  *RADIOLOGY REPORT*  Clinical Data: Left hip and proximal leg pain following a fall today.  PELVIS - 1-2 VIEW   Comparison: None.  Findings: Left intertrochanteric fracture with varus angulation. Diffuse osteopenia.  IMPRESSION: Left intertrochanteric fracture with varus angulation.  Original Report Authenticated By: Darrol Angel, M.D.   Dg Hip Operative Left  07/14/2012  *RADIOLOGY REPORT*  Clinical Data: Hip fracture  OPERATIVE LEFT HIP  Comparison: Pelvic and left femur radiographs - 07/13/2012  Findings:  Four spot intraoperative fluoroscopic images of the left hip during side plate and dynamic screw fixation of previously identified left intertrochanteric fracture are provided for review.  There is no definite evidence of hardware failure or loosening. Alignment appears near anatomic.  No radiopaque foreign body.  IMPRESSION: Post ORIF of left intertrochanteric fracture without complicating feature.  Original Report Authenticated By: Waynard Reeds, M.D.   Dg Femur Left  07/13/2012  *RADIOLOGY REPORT*  Clinical Data: Left hip and proximal leg pain following a fall today.  LEFT FEMUR - 2 VIEW  Comparison: Pelvis radiograph obtained at the same time.  Findings: Previously described left intertrochanteric fracture with varus angulation.  No additional fractures are seen and no dislocations are demonstrated.  Diffuse osteopenia.  Mild atheromatous arterial calcifications.  IMPRESSION: Left intertrochanteric fracture with varus angulation.  Original Report Authenticated By: Darrol Angel, M.D.    Medications: I have reviewed the patient's current medications.  Assesment: 1. Hip fracture and S/P ORIF 2. H/O PUD 3.Dementia 4. S/P pacemaker insertion 5. Post op Anemia Active Problems:  Hip fracture, left   Plan: Medications reviewed.  Blood transfusion as planned We will monitor cbc/BMP Surgery  as per orthopedics plan.    LOS: 2 days   Yahaira Bruski 07/15/2012, 7:28 AM

## 2012-07-15 NOTE — Addendum Note (Signed)
Addendum  created 07/15/12 1139 by Moshe Salisbury, CRNA   Modules edited:Notes Section

## 2012-07-15 NOTE — Progress Notes (Signed)
Subjective: 1 Day Post-Op Procedure(s) (LRB): OPEN REDUCTION INTERNAL FIXATION HIP (Left) Patient reports pain as 4 on 0-10 scale.    Objective: Vital signs in last 24 hours: Temp:  [97.5 F (36.4 C)-98.7 F (37.1 C)] 97.5 F (36.4 C) (07/31 0537) Pulse Rate:  [58-94] 71  (07/31 0537) Resp:  [14-71] 20  (07/31 0537) BP: (89-158)/(44-67) 133/63 mmHg (07/31 0537) SpO2:  [77 %-100 %] 100 % (07/31 0537) Weight:  [60.5 kg (133 lb 6.1 oz)-61 kg (134 lb 7.7 oz)] 61 kg (134 lb 7.7 oz) (07/31 0447)  Intake/Output from previous day: 07/30 0701 - 07/31 0700 In: 3545 [I.V.:3245; IV Piggyback:300] Out: 1950 [Urine:1850; Blood:100] Intake/Output this shift:     Basename 07/15/12 0502 07/14/12 1215 07/14/12 0505 07/13/12 1941  HGB 9.0* 10.3* 10.7* 11.4*    Basename 07/15/12 0502 07/14/12 1215  WBC 10.1 12.9*  RBC 3.03* 3.50*  HCT 27.9* 32.4*  PLT 138* 153    Basename 07/15/12 0502 07/13/12 1941  NA 138 139  K 4.0 4.0  CL 102 101  CO2 31 30  BUN 21 39*  CREATININE 0.97 1.06  GLUCOSE 103* 152*  CALCIUM 8.9 10.1   No results found for this basename: LABPT:2,INR:2 in the last 72 hours  Neurologically intact Neurovascular intact Sensation intact distally Intact pulses distally Dorsiflexion/Plantar flexion intact Incision: scant drainage  I have removed the hemovac.  Assessment/Plan: 1 Day Post-Op Procedure(s) (LRB): OPEN REDUCTION INTERNAL FIXATION HIP (Left) Up with therapy  I will be going out of town until Monday, August 5.  Dr. Romeo Apple will not be in town until tomorrow.  There will be a period of time of no orthopaedic coverage that was explained to patient and family prior to procedures and admission.  She is stable today.  She will begin PT.  I have ordered two units of blood for transfusion related to loss of blood from fracture and from surgery.     [name] 07/15/2012, 7:20 AM

## 2012-07-15 NOTE — Addendum Note (Signed)
Addendum  created 07/15/12 1037 by Moshe Salisbury, CRNA   Modules edited:Charges VN

## 2012-07-15 NOTE — Progress Notes (Signed)
Physical Therapy Treatment Patient Details Name: [name] MRN: [id number] DOB: [birth year] Today's Date: 07/15/2012 Time: 9147-8295 PT Time Calculation (min): 33 min  PT Assessment / Plan / Recommendation Comments on Treatment Session  Pt slightly more mobile this afternoon but she is not anxious to try to ambulate.      Follow Up Recommendations  Skilled nursing facility    Barriers to Discharge None      Equipment Recommendations  Defer to next venue    Recommendations for Other Services    Frequency Min 5X/week   Plan Discharge plan remains appropriate    Precautions / Restrictions Precautions Precautions: Fall Restrictions Weight Bearing Restrictions: Yes LLE Weight Bearing: Touchdown weight bearing   Pertinent Vitals/Pain     Mobility  Bed Mobility Bed Mobility: Supine to Sit;Sit to Supine Supine to Sit: 2: Max assist;HOB elevated Sit to Supine: 3: Mod assist;HOB flat Transfers Transfers: Sit to Stand;Stand to Sit;Stand Pivot Transfers Sit to Stand: 3: Mod assist;From chair/3-in-1;With upper extremity assist Stand to Sit: To bed;To chair/3-in-1;With upper extremity assist Stand Pivot Transfers: 2: Max assist Squat Pivot Transfers: Not tested (comment) Ambulation/Gait Ambulation/Gait Assistance: Not tested (comment)    Exercises General Exercises - Lower Extremity Ankle Circles/Pumps: AROM;Both;10 reps;Supine Quad Sets: AROM;Both;10 reps;Supine Gluteal Sets: AROM;Both;10 reps;Supine Short Arc Quad: AAROM;10 reps;Supine;Left Heel Slides: AAROM;Left;10 reps;Supine Hip ABduction/ADduction: AAROM;Left;10 reps;Supine   PT Diagnosis: Difficulty walking;Generalized weakness  PT Problem List: Decreased strength;Decreased activity tolerance;Decreased range of motion;Decreased mobility;Decreased cognition;Decreased safety awareness;Decreased knowledge of precautions;Pain PT Treatment Interventions: DME instruction;Gait training;Functional mobility  training;Therapeutic activities;Therapeutic exercise;Patient/family education   PT Goals Acute Rehab PT Goals PT Goal Formulation: With patient Time For Goal Achievement: 07/22/12 Potential to Achieve Goals: Good Pt will go Supine/Side to Sit: with mod assist;with HOB not 0 degrees (comment degree) PT Goal: Supine/Side to Sit - Progress: Goal set today Pt will go Sit to Supine/Side: with min assist PT Goal: Sit to Supine/Side - Progress: Updated due to goal met Pt will go Sit to Stand: with min assist;with upper extremity assist PT Goal: Sit to Stand - Progress: Updated due to goal met Pt will go Stand to Sit: with min assist;with upper extremity assist PT Goal: Stand to Sit - Progress: Updated due to goals met Pt will Ambulate: 1 - 15 feet;with max assist;with rolling walker PT Goal: Ambulate - Progress: Not progressing  Visit Information  Last PT Received On: 07/15/12    Subjective Data  Subjective: feels like she needs to use the bathroom Patient Stated Goal: none stated   Cognition  Overall Cognitive Status: History of cognitive impairments - at baseline Arousal/Alertness: Awake/alert Orientation Level: Appears intact for tasks assessed Behavior During Session: [facility] for tasks performed Cognition - Other Comments: pt is very pleasant and cooperative...has impaired memory    Balance  Balance Balance Assessed: No  End of Session PT - End of Session Equipment Utilized During Treatment: Gait belt Activity Tolerance: Patient tolerated treatment well Patient left: in bed;with call bell/phone within reach;with bed alarm set;with family/visitor present Nurse Communication: Mobility status   GP     Konrad Penta 07/15/2012, 11:48 AM

## 2012-07-15 NOTE — Evaluation (Signed)
Physical Therapy Evaluation Patient Details Name: ADENIKE SHIDLER MRN: [id number] DOB: [birth year] Today's Date: 07/15/2012 Time: 0925-1008 PT Time Calculation (min): 43 min  PT Assessment / Plan / Recommendation Clinical Impression  Pt is very cooperative,mobility severely impaired by recent hip OTIF.  She thinks she is here because she had a heart attack.  We were able to complete ther ex, bed mobility and transfer bed to chair without much difficulty.  she needed max assist for transfers, unable to take any steps.  Will progress per hip fx protocol.    PT Assessment  Patient needs continued PT services    Follow Up Recommendations  Skilled nursing facility    Barriers to Discharge None      Equipment Recommendations  Defer to next venue    Recommendations for Other Services     Frequency Min 5X/week    Precautions / Restrictions Precautions Precautions: Fall Restrictions Weight Bearing Restrictions: Yes LLE Weight Bearing: Touchdown weight bearing   Pertinent Vitals/Pain       Mobility  Bed Mobility Bed Mobility: Supine to Sit;Sit to Supine Supine to Sit: 2: Max assist;HOB elevated Sit to Supine: Not Tested (comment) Transfers Transfers: Sit to Stand;Stand to Sit;Stand Pivot Transfers Sit to Stand: 2: Max assist;With upper extremity assist;From bed Stand to Sit: 2: Max assist;With upper extremity assist;To bed Stand Pivot Transfers: 2: Max Designer, television/film set Transfers: Not tested (comment) Ambulation/Gait Ambulation/Gait Assistance: Not tested (comment) (unable)    Exercises General Exercises - Lower Extremity Ankle Circles/Pumps: AROM;Both;10 reps;Supine Quad Sets: AROM;Both;10 reps;Supine Short Arc Quad: AAROM;Left;10 reps;Supine Heel Slides: AAROM;Left;10 reps;Supine Hip ABduction/ADduction: AAROM;Left;5 reps;Supine (painful)   PT Diagnosis: Difficulty walking;Generalized weakness  PT Problem List: Decreased strength;Decreased activity  tolerance;Decreased range of motion;Decreased mobility;Decreased cognition;Decreased safety awareness;Decreased knowledge of precautions;Pain PT Treatment Interventions: DME instruction;Gait training;Functional mobility training;Therapeutic activities;Therapeutic exercise;Patient/family education   PT Goals Acute Rehab PT Goals PT Goal Formulation: With patient Time For Goal Achievement: 07/22/12 Potential to Achieve Goals: Good Pt will go Supine/Side to Sit: with mod assist;with HOB not 0 degrees (comment degree) PT Goal: Supine/Side to Sit - Progress: Goal set today Pt will go Sit to Supine/Side: with HOB not 0 degrees (comment degree);with max assist PT Goal: Sit to Supine/Side - Progress: Goal set today Pt will go Sit to Stand: with mod assist;with upper extremity assist PT Goal: Sit to Stand - Progress: Goal set today Pt will go Stand to Sit: with mod assist;with upper extremity assist PT Goal: Stand to Sit - Progress: Goal set today Pt will Ambulate: 1 - 15 feet;with max assist;with rolling walker PT Goal: Ambulate - Progress: Goal set today  Visit Information  Last PT Received On: 07/15/12    Subjective Data  Subjective: I think I had a heart attack Patient Stated Goal: none stated   Prior Functioning  Home Living Available Help at Discharge: Personal care attendant Type of Home: Skilled Nursing Facility Home Access: Level entry Additional Comments: ambulates with walker Prior Function Level of Independence:  (unknown) Able to Take Stairs?: No Driving: No Vocation: Retired Musician: No difficulties    Cognition  Overall Cognitive Status: History of cognitive impairments - at baseline Arousal/Alertness: Awake/alert Orientation Level: Appears intact for tasks assessed Behavior During Session: [facility] for tasks performed Cognition - Other Comments: pt is very pleasant and cooperative...has impaired memory    Extremity/Trunk Assessment Right Upper  Extremity Assessment RUE ROM/Strength/Tone: WFL for tasks assessed RUE Sensation: WFL - Light Touch;WFL - Proprioception RUE Coordination: WFL -  gross motor Left Upper Extremity Assessment LUE ROM/Strength/Tone: WFL for tasks assessed LUE Sensation: WFL - Light Touch;WFL - Proprioception LUE Coordination: WFL - gross motor Right Lower Extremity Assessment RLE ROM/Strength/Tone: WFL for tasks assessed RLE Sensation: WFL - Light Touch;WFL - Proprioception RLE Coordination: WFL - gross motor Left Lower Extremity Assessment LLE ROM/Strength/Tone: Unable to fully assess;Due to pain Trunk Assessment Trunk Assessment: Normal   Balance Balance Balance Assessed: No  End of Session PT - End of Session Equipment Utilized During Treatment: Gait belt Activity Tolerance: Patient tolerated treatment well;Patient limited by fatigue Patient left: in chair;with call bell/phone within reach;with chair alarm set Nurse Communication: Mobility status  GP     Konrad Penta 07/15/2012, 10:18 AM

## 2012-07-15 NOTE — Op Note (Signed)
[name], [name]              ACCOUNT NO.:  [id number]  MEDICAL RECORD NO.:  [id number]  LOCATION:  A314                          FACILITY:  [facility]  PHYSICIAN:  J. Darreld Mclean, M.D. DATE OF BIRTH:  [birth year]  DATE OF PROCEDURE: DATE OF DISCHARGE:                              OPERATIVE REPORT   PREOPERATIVE DIAGNOSIS:  Intertrochanteric fracture of left hip.  POSTOPERATIVE DIAGNOSIS:  Intertrochanteric fracture of left hip.  PROCEDURE:  Open treatment internal fixation of the left hip intertrochanteric fracture using Richards/Smith and Nephew, compression hip screw system with 85-mm compression screw, 140 degree, 4 hole short barrel side plate.  ANESTHESIA:  Spinal.  SURGEON:  J. Darreld Mclean, M.D.  DRAIN:  One large Hemovac drain.  No blood given.  ESTIMATED BLOOD LOSS:  Around 200 mL.  INDICATIONS:  The patient 78 year old female who fell yesterday at the nursing home and injured her left hip.  There are no other injuries.  X- ray showed displaced intertrochanteric fracture of the hip.  The patient was seen by medicine who has not felt a candidate for the procedure, although she is in increased risk.  I have notified medicine emergency room prior to admission that I will be out of town beginning tomorrow mid morning and never be a gap in orthopedic coverage, so Dr. Jacky Kindle got back in town on Thursday, so this was understood that there should be no problem and agreed to do this prior to me seeing the patient.  I also informed the patient when she came in and she said she understood and was left with patient's sister know as well.  I went over the risks and imponderables including infection, possible pulmonary embolism, which could lead to death, need for anticoagulation, and need for physical therapy.  Continuation of the nursing home. Recommended spinal anesthesia, also talked about the possibility of blood transfusion, this was understood.  DESCRIPTION OF  PROCEDURE:  The patient was seen in the holding area. The left hip was identified as the correct surgical site.  I placed a mark on the left hip just brought back to the operating room, placed in a fracture table.  At that time for Radiology and the C-arm unit was there and everyone had on their lead apron, thyroid Shields and x-ray bandages.  Scout films were taken under good position alignment of the fracture was well reduced on the fracture table.  The patient prepped and draped in usual manner.  We had a time-out, the patient was identified again as [name] to her left hip for intertrochanteric fracture of the hip.  All instrumentation was properly positioned and working, OR team knew each other.  Incision made through skin, subcutaneous tissue, tensor fascia lata, vastus lateralis,.  A guide pin was placed with good AP and lateral views, measured 85 mm.  An 85-mm step drill was used and 140 degree portal side plate was then placed after the compression screw was placed.  Compression was applied to the system.  Drill holes were made and these measured 45 minutes and screws were placed measured 42-38 mm in length.  Prior x-rays were taken, Hemovac drain was placed, sewn with 2-0 silk suture.  The vastus lateralis reapproximated using locking running #1 Surgilon suture.  Tensor fascia lata reapproximated using interrupted figure-of-eight #1 Surgilon suture.  Subcutaneous tissue closed in layers using 2-0 plain.  Skin reapproximated with skin staples.  Sterile dressing applied.  The patient tolerated procedure well, went to recovery in good condition.          ______________________________ Shela Commons. Darreld Mclean, M.D.     JWK/MEDQ  D:  07/14/2012  T:  07/15/2012  Job:  [id number]

## 2012-07-15 NOTE — Addendum Note (Signed)
Addendum  created 07/15/12 0925 by Franco Nones, CRNA   Modules edited:Charges VN

## 2012-07-16 ENCOUNTER — Encounter (HOSPITAL_COMMUNITY): Payer: Self-pay | Admitting: Orthopaedic Surgery

## 2012-07-16 LAB — TYPE AND SCREEN
ABO/RH(D): A POS
Antibody Screen: NEGATIVE
Unit division: 0
Unit division: 0

## 2012-07-16 LAB — BASIC METABOLIC PANEL
BUN: 18 mg/dL (ref 6–23)
Chloride: 103 mEq/L (ref 96–112)
Creatinine, Ser: 0.86 mg/dL (ref 0.50–1.10)
GFR calc Af Amer: 68 mL/min — ABNORMAL LOW (ref >90)
GFR calc non Af Amer: 59 mL/min — ABNORMAL LOW (ref >90)
Potassium: 3.9 mEq/L (ref 3.5–5.1)

## 2012-07-16 MED ORDER — ALBUTEROL SULFATE (5 MG/ML) 0.5% IN NEBU: 2.5000 mg | INHALATION_SOLUTION | RESPIRATORY_TRACT | Status: DC | PRN

## 2012-07-16 NOTE — Progress Notes (Signed)
Physical Therapy Treatment Patient Details Name: [name] MRN: [id number] DOB: 1921/09/20 Today's Date: 07/16/2012 Time: 6295-2841 PT Time Calculation (min): 29 min  PT Assessment / Plan / Recommendation Comments on Treatment Session  pt is progressing very slowly.  [name] remains very cooperative.    Follow Up Recommendations       Barriers to Discharge        Equipment Recommendations       Recommendations for Other Services    Frequency     Plan Discharge plan remains appropriate;Frequency remains appropriate    Precautions / Restrictions Restrictions Weight Bearing Restrictions: Yes LLE Weight Bearing: Touchdown weight bearing   Pertinent Vitals/Pain     Mobility  Bed Mobility Supine to Sit: 2: Max assist;HOB elevated Transfers Sit to Stand: 3: Mod assist;With upper extremity assist;From bed Stand to Sit: 3: Mod assist;To chair/3-in-1;With upper extremity assist Ambulation/Gait Ambulation/Gait Assistance: 2: Max assist Ambulation Distance (Feet): 2 Feet Assistive device: Rolling walker General Gait Details: Pt is unable to off load either foot in order to take a step.  [name] slides each foot in order to move bed to chair.  For transfers, [name] will be more functional doing a standing pivot transfer and this was discussed with aide Stairs: No Wheelchair Mobility Wheelchair Mobility: No    Exercises General Exercises - Lower Extremity Ankle Circles/Pumps: AROM;Both;10 reps;Supine Quad Sets: AROM;Both;10 reps;Supine Gluteal Sets: AROM;Both;10 reps;Supine Short Arc Quad: AAROM;Both;10 reps;Supine Heel Slides: AAROM;Left;10 reps;Supine Hip ABduction/ADduction: AAROM;Left;10 reps;Supine   PT Diagnosis:    PT Problem List:   PT Treatment Interventions:     PT Goals Acute Rehab PT Goals PT Goal: Supine/Side to Sit - Progress: Progressing toward goal PT Goal: Sit to Supine/Side - Progress: Progressing toward goal PT Goal: Sit to Stand - Progress: Progressing  toward goal PT Goal: Stand to Sit - Progress: Progressing toward goal PT Goal: Ambulate - Progress: Progressing toward goal  Visit Information  Last PT Received On: 07/16/12    Subjective Data  Subjective: no c/o   Cognition       Balance     End of Session PT - End of Session Equipment Utilized During Treatment: Gait belt Activity Tolerance: Patient tolerated treatment well Patient left: in chair;with call bell/phone within reach;with chair alarm set Nurse Communication: Mobility status   GP     Myrlene Broker L 07/16/2012, 10:03 AM

## 2012-07-16 NOTE — Progress Notes (Signed)
Physical Therapy Treatment Patient Details Name: [name] MRN: [id number] DOB: [birth year] Today's Date: 07/16/2012 Time: 4098-1191 PT Time Calculation (min): 33 min  PT Assessment / Plan / Recommendation Comments on Treatment Session  Pt continues to be very cooperative and tries her best.  She is very fearful of trying to stand and walk "too soon".  I have reassured her that the therapists will guide her along at the pace with which she feels comfortable.    Follow Up Recommendations       Barriers to Discharge        Equipment Recommendations       Recommendations for Other Services    Frequency     Plan Discharge plan remains appropriate;Frequency remains appropriate    Precautions / Restrictions     Pertinent Vitals/Pain     Mobility  Bed Mobility Supine to Sit: 2: Max assist;HOB elevated Sit to Supine: 3: Mod assist;HOB flat Transfers Sit to Stand: 3: Mod assist;With upper extremity assist;From bed Stand to Sit: 3: Mod assist;To chair/3-in-1;With upper extremity assist Stand Pivot Transfers: 2: Max assist;With armrests Ambulation/Gait Ambulation/Gait Assistance: 2: Max assist Ambulation Distance (Feet): 2 Feet Assistive device: Rolling walker General Gait Details: Pt is unable to off load either foot in order to take a step.  She slides each foot in order to move bed to chair.  For transfers, she will be more functional doing a standing pivot transfer and this was discussed with aide Stairs: No Wheelchair Mobility Wheelchair Mobility: No    Exercises General Exercises - Lower Extremity Ankle Circles/Pumps: AROM;Both;10 reps;Supine Quad Sets: AROM;Both;10 reps;Supine Gluteal Sets: AROM;Both;10 reps;Supine Short Arc Quad: AAROM;Left;10 reps;Supine Heel Slides: AAROM;Left;10 reps;Supine Hip ABduction/ADduction: AAROM;Left;10 reps;Supine   PT Diagnosis:    PT Problem List:   PT Treatment Interventions:     PT Goals Acute Rehab PT Goals PT Goal:  Supine/Side to Sit - Progress: Progressing toward goal PT Goal: Sit to Supine/Side - Progress: Progressing toward goal PT Goal: Sit to Stand - Progress: Progressing toward goal PT Goal: Stand to Sit - Progress: Progressing toward goal PT Goal: Ambulate - Progress: Not progressing  Visit Information  Last PT Received On: 07/16/12    Subjective Data  Subjective: no c/o   Cognition       Balance     End of Session PT - End of Session Equipment Utilized During Treatment: Gait belt Activity Tolerance: Patient tolerated treatment well Patient left: in bed;with call bell/phone within reach;with bed alarm set;with family/visitor present Nurse Communication: Mobility status   GP     Myrlene Broker L 07/16/2012, 1:11 PM

## 2012-07-16 NOTE — Progress Notes (Signed)
Subjective: Patient feels better today. Her lt hip pain is better. No new complaint.  Objective: Vital signs in last 24 hours: Temp:  [97.4 F (36.3 C)-99.2 F (37.3 C)] 98.4 F (36.9 C) (08/01 0536) Pulse Rate:  [69-80] 70  (08/01 0536) Resp:  [16-20] 18  (08/01 0536) BP: (84-155)/(41-76) 123/66 mmHg (08/01 0536) SpO2:  [93 %-98 %] 93 % (08/01 0536) FiO2 (%):  [21 %] 21 % (07/31 1142) Weight:  [62.1 kg (136 lb 14.5 oz)] 62.1 kg (136 lb 14.5 oz) (08/01 0536) Weight change: 1.6 kg (3 lb 8.4 oz) Last BM Date: 07/13/12  Intake/Output from previous day: 07/31 0701 - 08/01 0700 In: 4182.2 [P.O.:700; I.V.:2061.3; Blood:1420.9] Out: 700 [Urine:700]  PHYSICAL EXAM General appearance: alert and no distress Resp: clear to auscultation bilaterally Cardio: S1, S2 normal GI: soft, non-tender; bowel sounds normal; no masses,  no organomegaly Extremities:  rt hip surgical site dressed. Jp drain in place  Lab Results:    @labtest @ [facility] No results found for this basename: PHART,PCO2,PO2ART,TCO2,HCO3 in the last 72 hours CULTURES Recent Results (from the past 240 hour(s))  URINE CULTURE     Status: Normal   Collection Time   07/13/12 10:17 PM      Component Value Range Status Comment   Specimen Description URINE, CATHETERIZED   Final    Special Requests NONE   Final    Culture  Setup Time 07/14/2012 00:44   Final    Colony Count NO GROWTH   Final    Culture NO GROWTH   Final    Report Status 07/15/2012 FINAL   Final   SURGICAL PCR SCREEN     Status: Abnormal   Collection Time   07/14/12 12:15 AM      Component Value Range Status Comment   MRSA, PCR POSITIVE (*) NEGATIVE Final    Staphylococcus aureus POSITIVE (*) NEGATIVE Final    Studies/Results: Dg Hip Operative Left  07/14/2012  *RADIOLOGY REPORT*  Clinical Data: Hip fracture  OPERATIVE LEFT HIP  Comparison: Pelvic and left femur radiographs - 07/13/2012  Findings:  Four spot intraoperative fluoroscopic images of the left hip  during side plate and dynamic screw fixation of previously identified left intertrochanteric fracture are provided for review.  There is no definite evidence of hardware failure or loosening. Alignment appears near anatomic.  No radiopaque foreign body.  IMPRESSION: Post ORIF of left intertrochanteric fracture without complicating feature.  Original Report Authenticated By: Waynard Reeds, M.D.    Medications: I have reviewed the patient's current medications.  Assesment: 1. Hip fracture and S/P ORIF 2. H/O PUD 3.Dementia 4. S/P pacemaker insertion 5. Post op Anemia Active Problems:  Hip fracture, left   Plan: Medications reviewed.  We will monitor cbc/BMP Physical therapy Surgery as per orthopedics plan.    LOS: 3 days   Naidelin Gugliotta 07/16/2012, 7:26 AM

## 2012-07-17 LAB — BASIC METABOLIC PANEL
Calcium: 9 mg/dL (ref 8.4–10.5)
Chloride: 103 mEq/L (ref 96–112)
Creatinine, Ser: 0.82 mg/dL (ref 0.50–1.10)
GFR calc Af Amer: 72 mL/min — ABNORMAL LOW (ref >90)
Sodium: 139 mEq/L (ref 135–145)

## 2012-07-17 MED ORDER — ENOXAPARIN SODIUM 30 MG/0.3ML ~~LOC~~ SOLN: 30.0000 mg | SUBCUTANEOUS | Status: DC

## 2012-07-17 MED ORDER — ALBUTEROL SULFATE (5 MG/ML) 0.5% IN NEBU: 2.5000 mg | INHALATION_SOLUTION | RESPIRATORY_TRACT | Status: DC | PRN

## 2012-07-17 MED ORDER — HYDROCODONE-ACETAMINOPHEN 5-325 MG PO TABS: 1.0000 | ORAL_TABLET | ORAL | Status: AC | PRN

## 2012-07-17 NOTE — Clinical Social Work Note (Signed)
Pt d/c today by MD back to [facility]. Pt, pt's sister, and facility are aware and agreeable. Pt to transfer via [facility]. D/C summary faxed.   [name], [place] [phone]

## 2012-07-17 NOTE — Progress Notes (Signed)
Report called to Tresa Endo, nurse at [facility].  Verbalized understanding.  Pt dc'd to facility.  [place], Candelaria Stagers 07/17/2012

## 2012-07-17 NOTE — Progress Notes (Signed)
Subjective: Complains of lt. Hip pain. She is sitting on chair.  Objective: Vital signs in last 24 hours: Temp:  [98.1 F (36.7 C)-98.3 F (36.8 C)] 98.2 F (36.8 C) (08/02 0622) Pulse Rate:  [71-76] 74  (08/02 0622) Resp:  [17-19] 17  (08/02 0622) BP: (99-159)/(55-72) 147/72 mmHg (08/02 0622) SpO2:  [90 %-98 %] 90 % (08/02 0622) Weight:  [62.2 kg (137 lb 2 oz)] 62.2 kg (137 lb 2 oz) (08/02 0622) Weight change: 0.1 kg (3.5 oz) Last BM Date: 07/13/12  Intake/Output from previous day: 08/01 0701 - 08/02 0700 In: 2541.3 [P.O.:700; I.V.:1841.3] Out: 1205 [Urine:1205]  PHYSICAL EXAM General appearance: alert and no distress Resp: clear to auscultation bilaterally Cardio: S1, S2 normal GI: soft, non-tender; bowel sounds normal; no masses,  no organomegaly Extremities:  rt hip surgical site dressed. Jp drain in place  Lab Results:    @labtest @ [facility] No results found for this basename: PHART,PCO2,PO2ART,TCO2,HCO3 in the last 72 hours CULTURES Recent Results (from the past 240 hour(s))  URINE CULTURE     Status: Normal   Collection Time   07/13/12 10:17 PM      Component Value Range Status Comment   Specimen Description URINE, CATHETERIZED   Final    Special Requests NONE   Final    Culture  Setup Time 07/14/2012 00:44   Final    Colony Count NO GROWTH   Final    Culture NO GROWTH   Final    Report Status 07/15/2012 FINAL   Final   SURGICAL PCR SCREEN     Status: Abnormal   Collection Time   07/14/12 12:15 AM      Component Value Range Status Comment   MRSA, PCR POSITIVE (*) NEGATIVE Final    Staphylococcus aureus POSITIVE (*) NEGATIVE Final    Studies/Results: No results found.  Medications: I have reviewed the patient's current medications.  Assesment: 1. Hip fracture and S/P ORIF 2. H/O PUD 3.Dementia 4. S/P pacemaker insertion 5. Post op Anemia Active Problems:  Hip fracture, left   Plan: Medications reviewed.  Continue PRN pain medication We will  monitor cbc/BMP Physical therapy Surgery as per orthopedics plan.    LOS: 4 days   Arlin Sass 07/17/2012, 7:28 AM

## 2012-07-17 NOTE — Discharge Summary (Signed)
Physician Discharge Summary  Patient ID: [name] MRN: [id number] DOB/AGE: 1921-10-28 78 y.o.  Admit date: 07/13/2012 Discharge date: 07/17/2012  Admission Diagnoses: left hip fracture   Discharge Diagnoses: left hip fracture  Active Problems:  Hip fracture, left Chief Complaint: left hip pain  HPI: She fell at [facility] nursing home. She has pain in the left hip. She normally uses a walker. She has some tenderness of the right knee. She has no other injury. X-rays show a displaced left hip intertrochanteric fracture. She had no head injury.  Past Medical History   Diagnosis  Date   .  Alzheimer disease    .  PUD (peptic ulcer disease)  01/14/2011     pyloric perforation, Dr . Lovell Sheehan   .  H. pylori infection    .  HTN (hypertension)    .  Hypophosphatemia    .  Hypoalbuminemia    .  Neuropathy    .  Arthritis       Discharged Condition: stable  Hospital Course: Admit 07/13/2012  Surgery OTIF LEFT HIP FOR INTERTROCH FRACTURE WITH [facility]. TRANFUSED 2 UNITS OF PRBC ON 07/15/2012. PROGRESSED SLOWLY IN PT AND HAD SOME MILD CONFUSION.   Consults: orthopedic surgery  Significant Diagnostic Studies: labs:  Results for Katherine, Ballard (MRN [id number]) as of 07/17/2012 10:44  Ref. Range 07/17/2012 05:13  Sodium Latest Range: 135-145 mEq/L 139  Potassium Latest Range: 3.5-5.1 mEq/L 3.6  Chloride Latest Range: 96-112 mEq/L 103  CO2 Latest Range: 19-32 mEq/L 31  BUN Latest Range: 6-23 mg/dL 13  Creat Latest Range: 0.50-1.10 mg/dL 7.82  Calcium Latest Range: 8.4-10.5 mg/dL 9.0  GFR calc non Af Amer Latest Range: >90 mL/min 62 (L)  GFR calc Af Amer Latest Range: >90 mL/min 72 (L)  Glucose Latest Range: 70-99 mg/dL 98    CBC    Component Value Date/Time   WBC 10.1 07/15/2012 0502   RBC 3.03* 07/15/2012 0502   HGB 9.0* 07/15/2012 0502   HCT 27.9* 07/15/2012 0502   PLT 138* 07/15/2012 0502   MCV 92.1 07/15/2012 0502   MCH 29.7 07/15/2012 0502   MCHC 32.3 07/15/2012 0502   RDW 14.5  07/15/2012 0502   LYMPHSABS 2.0 07/15/2012 0502   MONOABS 1.6* 07/15/2012 0502   EOSABS 0.1 07/15/2012 0502   BASOSABS 0.0 07/15/2012 0502     Treatments: surgery: OTIF LEFT HIP ON 07/15/2012, [facility] BY [facility]   Discharge Exam: Blood pressure 147/72, pulse 74, temperature 98.2 F (36.8 C), temperature source Oral, resp. rate 17, height 5\' 3"  (1.6 m), weight 62.2 kg (137 lb 2 oz), SpO2 90.00%. General appearance: alert and cooperative Extremities: edema NONE and Homans sign is negative, no sign of DVT Neurologic: Mental status: Alert, oriented, thought content appropriate Incision/Wound:CLEAN NO DRAINAGE   Disposition: 03-Skilled Nursing Facility-[facility]  Discharge Orders    Future Appointments: Provider: Department: Dept Phone: Center:   09/14/2012 8:05 AM [facility] [phone] [facility]     Future Orders Please Complete By Expires   Diet - low sodium heart healthy      Call MD / Call 911      Comments:   If you experience chest pain or shortness of breath, CALL 911 and be transported to the hospital emergency room.  If you develope a fever above 101 F, pus (white drainage) or increased drainage or redness at the wound, or calf pain, call your surgeon's office.   Constipation Prevention  Comments:   Drink plenty of fluids.  Prune juice may be helpful.  You may use a stool softener, such as Colace (over the counter) 100 mg twice a day.  Use MiraLax (over the counter) for constipation as needed.   Increase activity slowly as tolerated      Discharge wound care:      Comments:   If you have a hip bandage, keep it clean and dry.  Change your bandage as instructed by your health care providers.  If your bandage has been discontinued, keep your incision clean and dry.  Pat dry after bathing.  DO NOT put lotion or powder on your incision.  Remove staples on august 10th apply steristrips   If any redness or drainage call MD [phone] 0r [phone]    Discharge instructions      Comments:   TD weight bearing   lovenox 30 days   No hip precautions needed     Medication List  As of 07/17/2012 10:36 AM   STOP taking these medications         acetaminophen 325 MG tablet         TAKE these medications         albuterol (5 MG/ML) 0.5% nebulizer solution   Commonly known as: PROVENTIL   Take 0.5 mLs (2.5 mg total) by nebulization every 4 (four) hours as needed for wheezing or shortness of breath.      citalopram 10 MG tablet   Commonly known as: CELEXA   Take 10 mg by mouth daily.      diclofenac 50 MG EC tablet   Commonly known as: VOLTAREN   Take by mouth 2 (two) times daily.      docusate sodium 100 MG capsule   Commonly known as: COLACE   Take 100 mg by mouth 2 (two) times daily.      enoxaparin 30 MG/0.3ML injection   Commonly known as: LOVENOX   Inject 0.3 mLs (30 mg total) into the skin daily.      furosemide 20 MG tablet   Commonly known as: LASIX   Take 20 mg by mouth every other day.      HYDROcodone-acetaminophen 5-325 MG per tablet   Commonly known as: NORCO/VICODIN   Take 1-2 tablets by mouth every 4 (four) hours as needed.      memantine 10 MG tablet   Commonly known as: NAMENDA   Take 10 mg by mouth 2 (two) times daily.      metoprolol tartrate 25 MG tablet   Commonly known as: LOPRESSOR   Take 25 mg by mouth 2 (two) times daily.      omeprazole 20 MG capsule   Commonly known as: PRILOSEC   Take 20 mg by mouth daily.      potassium chloride SA 20 MEQ tablet   Commonly known as: K-DUR,KLOR-CON   Take 20 mEq by mouth daily.      simvastatin 10 MG tablet   Commonly known as: ZOCOR   Take 10 mg by mouth daily.           Follow-up Information    Follow up with Darreld Mclean, MD. Schedule an appointment as soon as possible for a visit on 08/07/2012.   Contact information:   [address] [place] [address] [phone]          Signed: Fuller Canada 07/17/2012,  10:36 AM

## 2012-07-17 NOTE — Clinical Social Work Note (Signed)
CSW reviewed chart. Slow progress with therapy. CSW will update [facility] and continue to follow. Planned return to [facility] when medically stable.  [name], [place] [phone]

## 2012-07-17 NOTE — Progress Notes (Signed)
Physical Therapy Treatment Patient Details Name: [name] MRN: [id number] DOB: 09-30-1921 Today's Date: 07/17/2012 Time: 4098-1191 PT Time Calculation (min): 31 min  PT Assessment / Plan / Recommendation Comments on Treatment Session  Pt remains very cooperative but fearful of trying to walk too soon.  She is tol therpy well but is slow to progress.    Follow Up Recommendations       Barriers to Discharge        Equipment Recommendations       Recommendations for Other Services    Frequency     Plan Discharge plan remains appropriate;Frequency remains appropriate    Precautions / Restrictions Restrictions Weight Bearing Restrictions: Yes LLE Weight Bearing: Touchdown weight bearing   Pertinent Vitals/Pain     Mobility  Transfers Sit to Stand: 2: Max assist;From chair/3-in-1;With armrests Stand to Sit: 2: Max assist;With upper extremity assist;To chair/3-in-1 Ambulation/Gait Ambulation/Gait Assistance: Not tested (comment)    Exercises General Exercises - Lower Extremity Ankle Circles/Pumps: AROM;Both;10 reps;Seated Quad Sets: AROM;Both;10 reps;Seated Gluteal Sets: AROM;Both;10 reps;Seated Short Arc Quad: AAROM;Left;10 reps;Seated Long Arc Quad: AAROM;Left;10 reps;Seated Heel Slides: AAROM;Left;10 reps;Seated Hip ABduction/ADduction: AAROM;Left;10 reps;Seated Other Exercises Other Exercises: sit to stand x 3 reps...stand 30 sec each time   PT Diagnosis:    PT Problem List:   PT Treatment Interventions:     PT Goals Acute Rehab PT Goals PT Goal: Sit to Stand - Progress: Progressing toward goal PT Goal: Stand to Sit - Progress: Progressing toward goal PT Goal: Ambulate - Progress: Discontinued (comment) (pt is not ready to begin gait)  Visit Information  Last PT Received On: 07/17/12    Subjective Data  Subjective: no c/o   Cognition       Balance     End of Session PT - End of Session Equipment Utilized During Treatment: Gait belt Activity  Tolerance: Patient tolerated treatment well Patient left: in chair;with call bell/phone within reach;with chair alarm set   GP     Myrlene Broker L 07/17/2012, 9:46 AM

## 2012-09-14 ENCOUNTER — Encounter: Payer: Medicare Other | Admitting: *Deleted

## 2012-09-18 ENCOUNTER — Encounter: Payer: Self-pay | Admitting: *Deleted

## 2013-01-04 IMAGING — CR DG CHEST 1V PORT
1 series · 1 of 1 positions shown · non-contrast
Comparison: 12/19/2007

CLINICAL DATA: Chest and shoulder pain.

PORTABLE CHEST - 1 VIEW

[view not recorded]
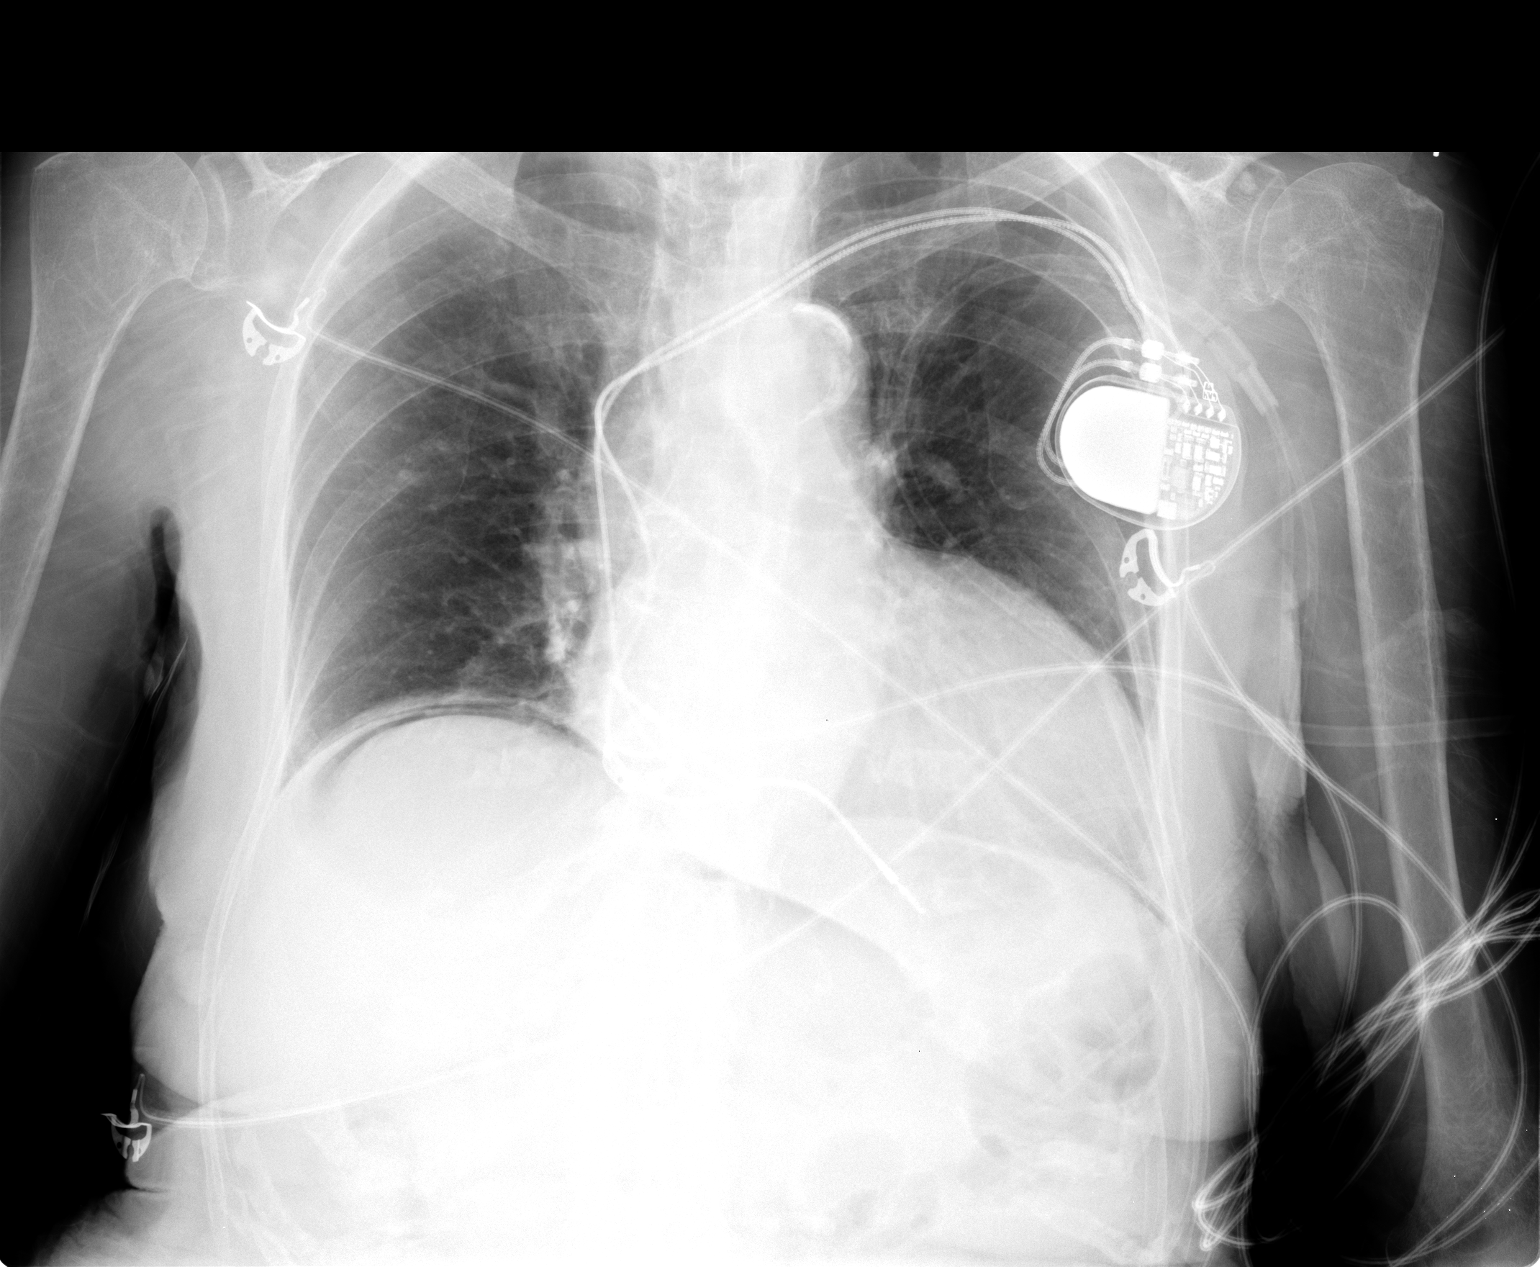

[1 of 1 positions shown; findings below may reference images not displayed]

FINDINGS: There is left ventricular prominence.  There is
atherosclerosis of the aorta.  Dual lead pacemaker is in place.
There is free air under the diaphragm.  The lungs are clear.
IMPRESSION: Free air under the diaphragm.  Presumed perforated viscus.

Critical test results telephoned to Dr. Choutka at the time of
interpretation on 01/14/2011 at 5550 hours.

## 2013-01-08 ENCOUNTER — Encounter: Payer: Self-pay | Admitting: *Deleted

## 2013-02-08 ENCOUNTER — Other Ambulatory Visit: Payer: Self-pay | Admitting: Internal Medicine

## 2013-02-08 ENCOUNTER — Ambulatory Visit (INDEPENDENT_AMBULATORY_CARE_PROVIDER_SITE_OTHER): Payer: Medicare Other | Admitting: *Deleted

## 2013-02-08 ENCOUNTER — Encounter: Payer: Self-pay | Admitting: Internal Medicine

## 2013-02-08 DIAGNOSIS — I442 Atrioventricular block, complete: Secondary | ICD-10-CM

## 2013-02-08 LAB — PACEMAKER DEVICE OBSERVATION
AL IMPEDENCE PM: 480 Ohm
AL THRESHOLD: 0.75 V
ATRIAL PACING PM: 98
BAMS-0001: 165 {beats}/min
RV LEAD IMPEDENCE PM: 496 Ohm
RV LEAD THRESHOLD: 0.75 V
VENTRICULAR PACING PM: 100

## 2013-02-08 NOTE — Progress Notes (Signed)
PPM check 

## 2013-06-09 ENCOUNTER — Ambulatory Visit (INDEPENDENT_AMBULATORY_CARE_PROVIDER_SITE_OTHER): Payer: Medicare Other | Admitting: Internal Medicine

## 2013-06-09 ENCOUNTER — Encounter: Payer: Self-pay | Admitting: Internal Medicine

## 2013-06-09 VITALS — BP 102/58 | HR 75 | Ht 63.0 in | Wt 119.0 lb

## 2013-06-09 DIAGNOSIS — I10 Essential (primary) hypertension: Secondary | ICD-10-CM

## 2013-06-09 DIAGNOSIS — I459 Conduction disorder, unspecified: Secondary | ICD-10-CM

## 2013-06-09 DIAGNOSIS — Z950 Presence of cardiac pacemaker: Secondary | ICD-10-CM

## 2013-06-09 LAB — PACEMAKER DEVICE OBSERVATION
AL IMPEDENCE PM: 448 Ohm
ATRIAL PACING PM: 99
BATTERY VOLTAGE: 2.73 V
RV LEAD IMPEDENCE PM: 484 Ohm
VENTRICULAR PACING PM: 100

## 2013-06-09 NOTE — Assessment & Plan Note (Signed)
Her blood pressure is well controlled today. She will continue her current medical therapy. 

## 2013-06-09 NOTE — Progress Notes (Signed)
HPI [name] returns today for followup. She is a very pleasant 79 year old woman with a history of symptomatic bradycardia secondary to complete heart block, hypertension, and Alzheimer's dementia. In the interim, she has been stable from an arrhythmia perspective. Unfortunately, her dementia has worsened and she sundowns quite severely. The patient admits to bad memory but denies chest pain or shortness of breath. Her sister who is with her today confirms this history. No Known Allergies   Current Outpatient Prescriptions  Medication Sig Dispense Refill  . albuterol (PROVENTIL) (5 MG/ML) 0.5% nebulizer solution Take 0.5 mLs (2.5 mg total) by nebulization every 4 (four) hours as needed for wheezing or shortness of breath.  20 mL  1  . citalopram (CELEXA) 10 MG tablet Take 10 mg by mouth daily.      . diclofenac (VOLTAREN) 50 MG EC tablet Take by mouth 2 (two) times daily.      Marland Kitchen docusate sodium (COLACE) 100 MG capsule Take 100 mg by mouth 2 (two) times daily.        . furosemide (LASIX) 20 MG tablet Take 20 mg by mouth every other day.      . memantine (NAMENDA) 10 MG tablet Take 10 mg by mouth 2 (two) times daily.      . metoprolol tartrate (LOPRESSOR) 25 MG tablet Take 25 mg by mouth 2 (two) times daily.      Marland Kitchen omeprazole (PRILOSEC) 20 MG capsule Take 20 mg by mouth daily.       . potassium chloride SA (K-DUR,KLOR-CON) 20 MEQ tablet Take 20 mEq by mouth daily.      Marland Kitchen senna (SENOKOT) 8.6 MG TABS Take 2 tablets by mouth.      . simvastatin (ZOCOR) 10 MG tablet Take 10 mg by mouth daily.      . promethazine (PHENERGAN) 12.5 MG tablet Take 12.5 mg by mouth every 6 (six) hours as needed for nausea.       No current facility-administered medications for this visit.     Past Medical History  Diagnosis Date  . Alzheimer disease   . PUD (peptic ulcer disease) 01/14/2011    pyloric perforation, Dr . Lovell Sheehan  . H. pylori infection   . HTN (hypertension)   . Hypophosphatemia   .  Hypoalbuminemia   . Neuropathy   . Arthritis   . Osteoporosis     ROS:   All systems reviewed and negative except as noted in the HPI.   Past Surgical History  Procedure Laterality Date  . Appendectomy  1947  . Perforated viscus  1/30/112    gastrorrhaphy-> Dr Lovell Sheehan  . Pacemaker placement    . Orif hip fracture  07/14/2012    Procedure: OPEN REDUCTION INTERNAL FIXATION HIP;  Surgeon: Darreld Mclean, MD;  Location: [facility];  Service: Orthopedics;  Laterality: Left;     Family History  Problem Relation Age of Onset  . Rectal cancer Sister 60  . Coronary artery disease Sister   . Brain cancer Sister   . Stroke Sister   . Alzheimer's disease Sister   . Stroke Mother   . Stroke Father   . Alzheimer's disease Mother      History   Social History  . Marital Status: Widowed    Spouse Name: N/A    Number of Children: 0  . Years of Education: N/A   Occupational History  . retired     Designer, fashion/clothing   Social History Main Topics  . Smoking status: Never Smoker   .  Smokeless tobacco: Not on file  . Alcohol Use: No  . Drug Use: No  . Sexually Active: No   Other Topics Concern  . Not on file   Social History Narrative   Lives at [facility], [place], #[phone] states she is POA/HCPOA           BP 102/58  Pulse 75  Ht 5\' 3"  (1.6 m)  Wt 119 lb (53.978 kg)  BMI 21.09 kg/m2  Physical Exam:  Well appearing 79 year old woman,NAD HEENT: Unremarkable Neck:  No JVD, no thyromegally Back:  No CVA tenderness Lungs:  Clear with no wheezes, rales, or rhonchi. Well-healed pacemaker incision. HEART:  Regular rate rhythm, no murmurs, no rubs, no clicks Abd:  soft, positive bowel sounds, no organomegally, no rebound, no guarding Ext:  2 plus pulses, no edema, no cyanosis, no clubbing Skin:  No rashes no nodules Neuro:  CN II through XII intact, motor grossly intact  EKG - normal sinus rhythm with AV sequential pacing and frequent PVCs  DEVICE  Normal  device function.  See PaceArt for details.   Assess/Plan:

## 2013-06-09 NOTE — Patient Instructions (Addendum)
Your physician recommends that you schedule a follow-up appointment in:  ONE YEAR WITH DR Ladona Ridgel AND 6 MONTHS DEVICE CHECK

## 2013-06-09 NOTE — Assessment & Plan Note (Signed)
Her Medtronic dual-chamber pacemaker is working normally. She has approximately 2 years of battery longevity. We'll recheck in several months.

## 2013-11-26 ENCOUNTER — Ambulatory Visit (INDEPENDENT_AMBULATORY_CARE_PROVIDER_SITE_OTHER): Payer: Medicare Other | Admitting: *Deleted

## 2013-11-26 DIAGNOSIS — I442 Atrioventricular block, complete: Secondary | ICD-10-CM

## 2013-11-26 NOTE — Progress Notes (Signed)
PPM check in office. 

## 2013-11-29 LAB — MDC_IDC_ENUM_SESS_TYPE_INCLINIC
Brady Statistic AP VP Percent: 99 %
Brady Statistic AS VS Percent: 0 %
Date Time Interrogation Session: 20141212204349
Lead Channel Impedance Value: 497 Ohm
Lead Channel Impedance Value: 498 Ohm
Lead Channel Pacing Threshold Amplitude: 0.75 V
Lead Channel Pacing Threshold Pulse Width: 0.4 ms
Lead Channel Pacing Threshold Pulse Width: 0.4 ms
Lead Channel Setting Sensing Sensitivity: 2.8 mV

## 2013-12-14 ENCOUNTER — Encounter: Payer: Self-pay | Admitting: Internal Medicine

## 2014-05-11 ENCOUNTER — Encounter: Payer: Self-pay | Admitting: Internal Medicine

## 2014-06-10 ENCOUNTER — Encounter: Payer: Medicare Other | Admitting: Internal Medicine

## 2014-06-20 ENCOUNTER — Encounter: Payer: Self-pay | Admitting: Internal Medicine

## 2014-06-20 ENCOUNTER — Ambulatory Visit (INDEPENDENT_AMBULATORY_CARE_PROVIDER_SITE_OTHER): Payer: Medicare Other | Admitting: Internal Medicine

## 2014-06-20 VITALS — BP 105/61 | HR 79 | Ht 62.0 in | Wt 121.8 lb

## 2014-06-20 DIAGNOSIS — Z950 Presence of cardiac pacemaker: Secondary | ICD-10-CM

## 2014-06-20 DIAGNOSIS — R609 Edema, unspecified: Secondary | ICD-10-CM

## 2014-06-20 DIAGNOSIS — I441 Atrioventricular block, second degree: Secondary | ICD-10-CM

## 2014-06-20 LAB — MDC_IDC_ENUM_SESS_TYPE_INCLINIC
Battery Impedance: 3276 Ohm
Battery Remaining Longevity: 11 mo
Battery Voltage: 2.69 V
Brady Statistic AS VS Percent: 0 %
Date Time Interrogation Session: 20150706141852
Lead Channel Impedance Value: 473 Ohm
Lead Channel Impedance Value: 491 Ohm
Lead Channel Pacing Threshold Amplitude: 0.5 V
Lead Channel Pacing Threshold Pulse Width: 0.4 ms
Lead Channel Setting Pacing Amplitude: 2.5 V
Lead Channel Setting Pacing Pulse Width: 0.4 ms
Lead Channel Setting Sensing Sensitivity: 2.8 mV
MDC IDC MSMT LEADCHNL RA PACING THRESHOLD PULSEWIDTH: 0.4 ms
MDC IDC MSMT LEADCHNL RA SENSING INTR AMPL: 0.35 mV
MDC IDC MSMT LEADCHNL RV PACING THRESHOLD AMPLITUDE: 0.5 V
MDC IDC SET LEADCHNL RA PACING AMPLITUDE: 2 V
MDC IDC STAT BRADY AP VP PERCENT: 99 %
MDC IDC STAT BRADY AP VS PERCENT: 0 %
MDC IDC STAT BRADY AS VP PERCENT: 1 %

## 2014-06-20 NOTE — Assessment & Plan Note (Signed)
Her Medtronic DDD PM is working normally. Will recheck in several months. She is approaching ERI.  

## 2014-06-20 NOTE — Patient Instructions (Addendum)
Your physician wants you to follow-up in: 6 MONTHS WITH [name] FOR A DEVICE CHECK &  1 YEAR WITH [name]. You will receive a reminder letter in the mail two months in advance. If you don't receive a letter, please call our office to schedule the follow-up appointment.  Your physician recommends that you continue on your current medications as directed. Please refer to the Current Medication list given to you today.

## 2014-06-20 NOTE — Assessment & Plan Note (Signed)
She remains euvolemic on exam. I have asked the patient to reduce her sodium intake.

## 2014-06-20 NOTE — Progress Notes (Signed)
HPI [name] returns today for followup. She is a very pleasant 80 year old woman with a history of symptomatic bradycardia secondary to complete heart block, hypertension, and Alzheimer's dementia. In the interim, she has been stable from an arrhythmia perspective. Unfortunately, her dementia has worsened and she sundowns quite severely. The patient admits to bad memory but denies chest pain or shortness of breath. Her sister who is with her today confirms this history. She states that she is eating well. No Known Allergies   Current Outpatient Prescriptions  Medication Sig Dispense Refill  . albuterol (PROVENTIL) (5 MG/ML) 0.5% nebulizer solution Take 2.5 mg by nebulization every 6 (six) hours as needed for wheezing or shortness of breath.      . cephALEXin (KEFLEX) 500 MG capsule Take 500 mg by mouth 4 (four) times daily.      . citalopram (CELEXA) 10 MG tablet Take 10 mg by mouth daily.      Marland Kitchen. docusate sodium (COLACE) 100 MG capsule Take 100 mg by mouth 2 (two) times daily.        . furosemide (LASIX) 20 MG tablet Take 20 mg by mouth daily.       . metoprolol tartrate (LOPRESSOR) 25 MG tablet Take 25 mg by mouth 2 (two) times daily.      . naproxen (NAPROSYN) 500 MG tablet 2 (two) times daily.      Marland Kitchen. omeprazole (PRILOSEC) 20 MG capsule Take 20 mg by mouth daily.       . potassium chloride SA (K-DUR,KLOR-CON) 20 MEQ tablet Take 20 mEq by mouth daily.      . promethazine (PHENERGAN) 12.5 MG tablet Take 12.5 mg by mouth every 6 (six) hours as needed for nausea.      Marland Kitchen. senna (SENOKOT) 8.6 MG TABS Take 2 tablets by mouth.      . simvastatin (ZOCOR) 20 MG tablet Take 20 mg by mouth daily.      Marland Kitchen. albuterol (PROVENTIL) (5 MG/ML) 0.5% nebulizer solution Take 0.5 mLs (2.5 mg total) by nebulization every 4 (four) hours as needed for wheezing or shortness of breath.  20 mL  1   No current facility-administered medications for this visit.     Past Medical History  Diagnosis Date  . Alzheimer  disease   . PUD (peptic ulcer disease) 01/14/2011    pyloric perforation, Dr . Lovell SheehanJenkins  . H. pylori infection   . HTN (hypertension)   . Hypophosphatemia   . Hypoalbuminemia   . Neuropathy   . Arthritis   . Osteoporosis     ROS:   All systems reviewed and negative except as noted in the HPI.   Past Surgical History  Procedure Laterality Date  . Appendectomy  1947  . Perforated viscus  1/30/112    gastrorrhaphy-> Dr Lovell SheehanJenkins  . Pacemaker placement    . Orif hip fracture  07/14/2012    Procedure: OPEN REDUCTION INTERNAL FIXATION HIP;  Surgeon: Darreld McleanWayne Keeling, MD;  Location: [facility];  Service: Orthopedics;  Laterality: Left;     Family History  Problem Relation Age of Onset  . Rectal cancer Sister [id number]  . Coronary artery disease Sister   . Brain cancer Sister   . Stroke Sister   . Alzheimer's disease Sister   . Stroke Mother   . Stroke Father   . Alzheimer's disease Mother      History   Social History  . Marital Status: Widowed    Spouse Name: N/A    Number of  Children: 0  . Years of Education: N/A   Occupational History  . retired     Designer, fashion/clothingtextiles   Social History Main Topics  . Smoking status: Never Smoker   . Smokeless tobacco: Not on file  . Alcohol Use: No  . Drug Use: No  . Sexual Activity: No   Other Topics Concern  . Not on file   Social History Narrative   Lives at [facility]   Sister, [name], #[phone] states she is POA/HCPOA           BP 105/61  Pulse 79  Ht 5\' 2"  (1.575 m)  Wt 121 lb 12.8 oz (55.248 kg)  BMI 22.27 kg/m2  Physical Exam:  Well appearing 80 year old woman,NAD HEENT: Unremarkable Neck:  No JVD, no thyromegally Back:  No CVA tenderness Lungs:  Clear with no wheezes, rales, or rhonchi. Well-healed pacemaker incision. HEART:  Regular rate rhythm, no murmurs, no rubs, no clicks Abd:  soft, positive bowel sounds, no organomegally, no rebound, no guarding Ext:  2 plus pulses, no edema, no cyanosis, no clubbing Skin:   No rashes no nodules Neuro:  CN II through XII intact, motor grossly intact   DEVICE  Normal device function.  See PaceArt for details.   Assess/Plan:

## 2014-06-27 ENCOUNTER — Encounter: Payer: Self-pay | Admitting: Internal Medicine

## 2014-07-08 ENCOUNTER — Other Ambulatory Visit: Payer: Self-pay | Admitting: Radiology

## 2014-07-11 ENCOUNTER — Encounter (HOSPITAL_COMMUNITY)
Admission: RE | Admit: 2014-07-11 | Discharge: 2014-07-11 | Disposition: A | Discharge status: Home / Self Care | Payer: Medicare Other | Source: Ambulatory Visit | Attending: Orthopaedic Surgery | Admitting: Orthopaedic Surgery

## 2014-07-11 ENCOUNTER — Encounter (HOSPITAL_COMMUNITY): Payer: Self-pay

## 2014-07-11 ENCOUNTER — Other Ambulatory Visit: Payer: Self-pay

## 2014-07-11 DIAGNOSIS — F3289 Other specified depressive episodes: Secondary | ICD-10-CM | POA: Diagnosis not present

## 2014-07-11 DIAGNOSIS — F329 Major depressive disorder, single episode, unspecified: Secondary | ICD-10-CM | POA: Diagnosis not present

## 2014-07-11 DIAGNOSIS — S71009A Unspecified open wound, unspecified hip, initial encounter: Secondary | ICD-10-CM | POA: Diagnosis not present

## 2014-07-11 DIAGNOSIS — Z950 Presence of cardiac pacemaker: Secondary | ICD-10-CM | POA: Diagnosis not present

## 2014-07-11 DIAGNOSIS — K279 Peptic ulcer, site unspecified, unspecified as acute or chronic, without hemorrhage or perforation: Secondary | ICD-10-CM | POA: Diagnosis not present

## 2014-07-11 DIAGNOSIS — I10 Essential (primary) hypertension: Secondary | ICD-10-CM | POA: Diagnosis not present

## 2014-07-11 DIAGNOSIS — Z792 Long term (current) use of antibiotics: Secondary | ICD-10-CM | POA: Diagnosis not present

## 2014-07-11 DIAGNOSIS — Z79899 Other long term (current) drug therapy: Secondary | ICD-10-CM | POA: Diagnosis not present

## 2014-07-11 HISTORY — DX: Depression, unspecified: F32.A

## 2014-07-11 HISTORY — DX: Major depressive disorder, single episode, unspecified: F32.9

## 2014-07-11 HISTORY — DX: Peripheral vascular disease, unspecified: I73.9

## 2014-07-11 LAB — CBC WITH DIFFERENTIAL/PLATELET
Basophils Absolute: 0 K/uL (ref 0.0–0.1)
Basophils Relative: 0 % (ref 0–1)
Eosinophils Absolute: 0.5 K/uL (ref 0.0–0.7)
Eosinophils Relative: 6 % — ABNORMAL HIGH (ref 0–5)
HCT: 33.7 % — ABNORMAL LOW (ref 36.0–46.0)
Hemoglobin: 10.7 g/dL — ABNORMAL LOW (ref 12.0–15.0)
Lymphocytes Relative: 28 % (ref 12–46)
Lymphs Abs: 2.3 K/uL (ref 0.7–4.0)
MCH: 29.5 pg (ref 26.0–34.0)
MCHC: 31.8 g/dL (ref 30.0–36.0)
MCV: 92.8 fL (ref 78.0–100.0)
Monocytes Absolute: 0.7 K/uL (ref 0.1–1.0)
Monocytes Relative: 9 % (ref 3–12)
Neutro Abs: 4.9 K/uL (ref 1.7–7.7)
Neutrophils Relative %: 57 % (ref 43–77)
Platelets: 197 K/uL (ref 150–400)
RBC: 3.63 MIL/uL — ABNORMAL LOW (ref 3.87–5.11)
RDW: 15.4 % (ref 11.5–15.5)
WBC: 8.4 K/uL (ref 4.0–10.5)

## 2014-07-11 LAB — COMPREHENSIVE METABOLIC PANEL
ALT: 7 U/L (ref 0–35)
AST: 15 U/L (ref 0–37)
Albumin: 3.6 g/dL (ref 3.5–5.2)
Alkaline Phosphatase: 59 U/L (ref 39–117)
Anion gap: 11 (ref 5–15)
BUN: 29 mg/dL — ABNORMAL HIGH (ref 6–23)
CALCIUM: 9.6 mg/dL (ref 8.4–10.5)
CO2: 29 mEq/L (ref 19–32)
Chloride: 102 mEq/L (ref 96–112)
Creatinine, Ser: 1.26 mg/dL — ABNORMAL HIGH (ref 0.50–1.10)
GFR, EST AFRICAN AMERICAN: 42 mL/min — AB (ref >90)
GFR, EST NON AFRICAN AMERICAN: 36 mL/min — AB (ref >90)
Glucose, Bld: 133 mg/dL — ABNORMAL HIGH (ref 70–99)
Potassium: 3.9 mEq/L (ref 3.7–5.3)
Sodium: 142 mEq/L (ref 137–147)
TOTAL PROTEIN: 6.3 g/dL (ref 6.0–8.3)
Total Bilirubin: 0.4 mg/dL (ref 0.3–1.2)

## 2014-07-11 LAB — URINALYSIS, ROUTINE W REFLEX MICROSCOPIC
BILIRUBIN URINE: NEGATIVE
Glucose, UA: NEGATIVE mg/dL
Hgb urine dipstick: NEGATIVE
Ketones, ur: NEGATIVE mg/dL
Leukocytes, UA: NEGATIVE
Nitrite: NEGATIVE
Protein, ur: NEGATIVE mg/dL
SPECIFIC GRAVITY, URINE: 1.02 (ref 1.005–1.030)
Urobilinogen, UA: 0.2 mg/dL (ref 0.0–1.0)
pH: 5.5 (ref 5.0–8.0)

## 2014-07-11 LAB — PROTIME-INR
INR: 1 (ref 0.00–1.49)
Prothrombin Time: 13.2 s (ref 11.6–15.2)

## 2014-07-11 LAB — SURGICAL PCR SCREEN
MRSA, PCR: NEGATIVE
Staphylococcus aureus: NEGATIVE

## 2014-07-11 LAB — APTT: aPTT: 30 s (ref 24–37)

## 2014-07-11 NOTE — Patient Instructions (Signed)
    [name]  07/11/2014   Your procedure is scheduled on:   07/14/2014   Report to [facility] at  615  AM.  Call this number if you have problems the morning of surgery: [phone]   Remember:   Do not eat food or drink liquids after midnight.   Take these medicines the morning of surgery with A SIP OF WATER:  Celexa, metoprolol, prilosec, phenergan. Take your albuterol before you come.   Do not wear jewelry, make-up or nail polish.  Do not wear lotions, powders, or perfumes.   Do not shave 48 hours prior to surgery. Men may shave face and neck.  Do not bring valuables to the hospital.  [facility] is not responsible for any belongings or valuables.               Contacts, dentures or bridgework may not be worn into surgery.  Leave suitcase in the car. After surgery it may be brought to your room.  For patients admitted to the hospital, discharge time is determined by your treatment team.               Patients discharged the day of surgery will not be allowed to drive home.  Name and phone number of your driver: family  Special Instructions: Shower using CHG 2 nights before surgery and the night before surgery.  If you shower the day of surgery use CHG.  Use special wash - you have one bottle of CHG for all showers.  You should use approximately 1/3 of the bottle for each shower.   Please read over the following fact sheets that you were given: Pain Booklet, Coughing and Deep Breathing, Surgical Site Infection Prevention, Anesthesia Post-op Instructions and Care and Recovery After Surgery PATIENT INSTRUCTIONS POST-ANESTHESIA  IMMEDIATELY FOLLOWING SURGERY:  Do not drive or operate machinery for the first twenty four hours after surgery.  Do not make any important decisions for twenty four hours after surgery or while taking narcotic pain medications or sedatives.  If you develop intractable nausea and vomiting or a severe headache please notify your doctor  immediately.  FOLLOW-UP:  Please make an appointment with your surgeon as instructed. You do not need to follow up with anesthesia unless specifically instructed to do so.  WOUND CARE INSTRUCTIONS (if applicable):  Keep a dry clean dressing on the anesthesia/puncture wound site if there is drainage.  Once the wound has quit draining you may leave it open to air.  Generally you should leave the bandage intact for twenty four hours unless there is drainage.  If the epidural site drains for more than 36-48 hours please call the anesthesia department.  QUESTIONS?:  Please feel free to call your physician or the hospital operator if you have any questions, and they will be happy to assist you.

## 2014-07-13 ENCOUNTER — Encounter (HOSPITAL_COMMUNITY): Payer: Self-pay | Admitting: *Deleted

## 2014-07-13 NOTE — H&P (Signed)
[name] is an 80 y.o. female.   Chief Complaint: Draining area left thigh  HPI: She had a hip intertrochanteric fracture July 14, 2012 for which I did surgery and put a hip compression screw in place.  She did well from that surgery.  She is currently a resident at [facility].  In mid to late April she started to have some drainage from a lesion/wound of the left hip.  Dr. Malvin JohnsBradford saw her in his office on May 7th and asked that I see her as the lesion was at the wound of the left hip.  I saw her that day.  Cultures were negative.  She had drainage.  I placed her on doxycyline.  The lesion cleared up in a week.  But a few weeks later it recurred with more swelling.  I aspirated the swelling around the lesion and cultures were negative again.  She was placed back on the doxycyline.  I subsequently changed to Keflex as her drug plan does not cover the doxycyline.  The wound again improved and there was no further drainage.  However, it has recurred.  I have recommended excision.  I do not know if it communicates to the hip itself or if it is above the fascia layer.  She can bear weight and the x-rays of the hip look good with no signs of infection or loosening or osteomyelitis.  She may need to be admitted to the hospital if the area is deep.    Past Medical History  Diagnosis Date  . PUD (peptic ulcer disease) 01/14/2011    pyloric perforation, Dr . Lovell SheehanJenkins  . H. pylori infection   . HTN (hypertension)   . Hypophosphatemia   . Hypoalbuminemia   . Neuropathy   . Arthritis   . Osteoporosis   . PVD (peripheral vascular disease)   . Alzheimer disease     Alzheimers  . Depression   . Pacemaker     Past Surgical History  Procedure Laterality Date  . Appendectomy  1947  . Perforated viscus  1/30/112    gastrorrhaphy-> Dr Lovell SheehanJenkins  . Pacemaker placement    . Orif hip fracture  07/14/2012    Procedure: OPEN REDUCTION INTERNAL FIXATION HIP;  Surgeon: Darreld McleanWayne Camellia Popescu, MD;  Location:  [facility];  Service: Orthopedics;  Laterality: Left;  . Insert / replace / remove pacemaker  01/22/2011    Family History  Problem Relation Age of Onset  . Rectal cancer Sister [id number]  . Coronary artery disease Sister   . Brain cancer Sister   . Stroke Sister   . Alzheimer's disease Sister   . Stroke Mother   . Stroke Father   . Alzheimer's disease Mother    Social History:  reports that she has never smoked. She does not have any smokeless tobacco history on file. She reports that she does not drink alcohol or use illicit drugs.  Allergies: No Known Allergies  No prescriptions prior to admission    No results found for this or any previous visit (from the past 48 hour(s)). No results found.  Review of Systems  Cardiovascular:       Hypertension, hyperlidemia, cardia pacemaker  Gastrointestinal: Positive for heartburn.       History rectal bleeding in past  Musculoskeletal: Positive for falls (broken hip 2013 July).    There were no vitals taken for this visit. Physical Exam  Constitutional: She is oriented to person, place, and time. She appears well-developed.  HENT:  Head: Normocephalic.  Eyes: Conjunctivae and EOM are normal. Pupils are equal, round, and reactive to light.  Neck: Normal range of motion. Neck supple.  Cardiovascular: Normal rate, regular rhythm and intact distal pulses.   Respiratory: Effort normal.  GI: Soft.  Musculoskeletal: She exhibits tenderness (Pain left hip area over old wound.  She has had chronic drainage of the area since April.  No redness.  Motion of hip very good.  She is unsteady with gait.).       Legs: Neurological: She is alert and oriented to person, place, and time. She has normal reflexes.  Skin: Skin is warm and dry.  Psychiatric: She has a normal mood and affect. Her behavior is normal. Judgment and thought content normal.     Assessment/Plan Wound drainage/lesion of the old left hip wound.  Post OTIF of the hip on the left two  years ago.  Plan wound exploration and excision of defect.  Some debriedment may be necessary.  It depends on what is found at surgery.  [name] 07/13/2014, 3:24 PM

## 2014-07-14 ENCOUNTER — Encounter (HOSPITAL_COMMUNITY): Payer: Medicare Other | Admitting: Anesthesiology

## 2014-07-14 ENCOUNTER — Ambulatory Visit (HOSPITAL_COMMUNITY): Payer: Medicare Other | Admitting: Anesthesiology

## 2014-07-14 ENCOUNTER — Ambulatory Visit (HOSPITAL_COMMUNITY)
Admission: RE | Admit: 2014-07-14 | Discharge: 2014-07-14 | Disposition: A | Discharge status: Home / Self Care | Payer: Medicare Other | Source: Ambulatory Visit | Attending: Orthopaedic Surgery | Admitting: Orthopaedic Surgery

## 2014-07-14 ENCOUNTER — Encounter (HOSPITAL_COMMUNITY)
Admission: RE | Disposition: A | Discharge status: Home / Self Care | Payer: Self-pay | Source: Ambulatory Visit | Attending: Orthopaedic Surgery

## 2014-07-14 ENCOUNTER — Encounter (HOSPITAL_COMMUNITY): Payer: Self-pay | Admitting: *Deleted

## 2014-07-14 DIAGNOSIS — F329 Major depressive disorder, single episode, unspecified: Secondary | ICD-10-CM | POA: Insufficient documentation

## 2014-07-14 DIAGNOSIS — I10 Essential (primary) hypertension: Secondary | ICD-10-CM | POA: Insufficient documentation

## 2014-07-14 DIAGNOSIS — S71009A Unspecified open wound, unspecified hip, initial encounter: Secondary | ICD-10-CM | POA: Insufficient documentation

## 2014-07-14 DIAGNOSIS — F3289 Other specified depressive episodes: Secondary | ICD-10-CM | POA: Insufficient documentation

## 2014-07-14 DIAGNOSIS — S71109A Unspecified open wound, unspecified thigh, initial encounter: Secondary | ICD-10-CM | POA: Diagnosis not present

## 2014-07-14 DIAGNOSIS — Z950 Presence of cardiac pacemaker: Secondary | ICD-10-CM | POA: Insufficient documentation

## 2014-07-14 DIAGNOSIS — K279 Peptic ulcer, site unspecified, unspecified as acute or chronic, without hemorrhage or perforation: Secondary | ICD-10-CM | POA: Insufficient documentation

## 2014-07-14 DIAGNOSIS — Z792 Long term (current) use of antibiotics: Secondary | ICD-10-CM | POA: Insufficient documentation

## 2014-07-14 DIAGNOSIS — Z79899 Other long term (current) drug therapy: Secondary | ICD-10-CM | POA: Insufficient documentation

## 2014-07-14 HISTORY — DX: Presence of cardiac pacemaker: Z95.0

## 2014-07-14 HISTORY — PX: WOUND EXPLORATION: SHX6188

## 2014-07-14 LAB — TYPE AND SCREEN
ABO/RH(D): A POS
Antibody Screen: NEGATIVE

## 2014-07-14 SURGERY — WOUND EXPLORATION
Anesthesia: Spinal | Site: Hip | Laterality: Left

## 2014-07-14 MED ORDER — ONDANSETRON HCL 4 MG/2ML IJ SOLN: 4.0000 mg | Freq: Once | INTRAMUSCULAR | Status: DC | PRN

## 2014-07-14 MED ORDER — FENTANYL CITRATE 0.05 MG/ML IJ SOLN: 25.0000 ug | INTRAMUSCULAR | Status: DC | PRN

## 2014-07-14 MED ORDER — CHLORHEXIDINE GLUCONATE 4 % EX LIQD: 60.0000 mL | Freq: Once | CUTANEOUS | Status: DC

## 2014-07-14 MED ORDER — LIDOCAINE HCL (PF) 1 % IJ SOLN
INTRAMUSCULAR | Status: AC
  Filled 2014-07-14: qty 5

## 2014-07-14 MED ORDER — CEFAZOLIN SODIUM-DEXTROSE 2-3 GM-% IV SOLR
2.0000 g | INTRAVENOUS | Status: AC
  Administered 2014-07-14: 2 g via INTRAVENOUS

## 2014-07-14 MED ORDER — LIDOCAINE IN DEXTROSE 5-7.5 % IV SOLN
INTRAVENOUS | Status: DC | PRN
  Administered 2014-07-14: 75 mg via INTRATHECAL

## 2014-07-14 MED ORDER — PROPOFOL INFUSION 10 MG/ML OPTIME
INTRAVENOUS | Status: DC | PRN
  Administered 2014-07-14: 100 ug/kg/min via INTRAVENOUS

## 2014-07-14 MED ORDER — LIDOCAINE IN DEXTROSE 5-7.5 % IV SOLN
INTRAVENOUS | Status: AC
  Filled 2014-07-14: qty 2

## 2014-07-14 MED ORDER — EPHEDRINE SULFATE 50 MG/ML IJ SOLN
INTRAMUSCULAR | Status: AC
  Filled 2014-07-14: qty 1

## 2014-07-14 MED ORDER — EPHEDRINE SULFATE 50 MG/ML IJ SOLN
INTRAMUSCULAR | Status: DC | PRN
  Administered 2014-07-14: 5 mg via INTRAVENOUS
  Administered 2014-07-14: 10 mg via INTRAVENOUS
  Administered 2014-07-14 (×3): 5 mg via INTRAVENOUS

## 2014-07-14 MED ORDER — SODIUM CHLORIDE 0.9 % IJ SOLN
INTRAMUSCULAR | Status: AC
  Filled 2014-07-14: qty 10

## 2014-07-14 MED ORDER — MIDAZOLAM HCL 2 MG/2ML IJ SOLN
1.0000 mg | INTRAMUSCULAR | Status: DC | PRN
  Administered 2014-07-14 (×2): 1 mg via INTRAVENOUS

## 2014-07-14 MED ORDER — FENTANYL CITRATE 0.05 MG/ML IJ SOLN
INTRAMUSCULAR | Status: AC
  Filled 2014-07-14: qty 2

## 2014-07-14 MED ORDER — FENTANYL CITRATE 0.05 MG/ML IJ SOLN
INTRAMUSCULAR | Status: DC | PRN
  Administered 2014-07-14: 12.5 ug via INTRATHECAL

## 2014-07-14 MED ORDER — 0.9 % SODIUM CHLORIDE (POUR BTL) OPTIME
TOPICAL | Status: DC | PRN
  Administered 2014-07-14 (×2): 1000 mL

## 2014-07-14 MED ORDER — LIDOCAINE HCL (CARDIAC) 10 MG/ML IV SOLN
INTRAVENOUS | Status: DC | PRN
Start: 2014-07-14 — End: 2014-07-14
  Administered 2014-07-14: 5 mg via INTRAVENOUS

## 2014-07-14 MED ORDER — MIDAZOLAM HCL 2 MG/2ML IJ SOLN
INTRAMUSCULAR | Status: AC
  Filled 2014-07-14: qty 2

## 2014-07-14 MED ORDER — HYDROCODONE-ACETAMINOPHEN 5-325 MG PO TABS: 1.0000 | ORAL_TABLET | ORAL | Status: AC | PRN

## 2014-07-14 MED ORDER — CEFAZOLIN SODIUM-DEXTROSE 2-3 GM-% IV SOLR
INTRAVENOUS | Status: AC
  Filled 2014-07-14: qty 50

## 2014-07-14 MED ORDER — FENTANYL CITRATE 0.05 MG/ML IJ SOLN
25.0000 ug | INTRAMUSCULAR | Status: AC
  Administered 2014-07-14 (×2): 25 ug via INTRAVENOUS

## 2014-07-14 MED ORDER — PROPOFOL 10 MG/ML IV BOLUS
INTRAVENOUS | Status: AC
  Filled 2014-07-14: qty 20

## 2014-07-14 MED ORDER — LACTATED RINGERS IV SOLN
INTRAVENOUS | Status: DC
  Administered 2014-07-14: 07:00:00 via INTRAVENOUS

## 2014-07-14 SURGICAL SUPPLY — 35 items
BAG HAMPER (MISCELLANEOUS) ×1 IMPLANT
BLADE SURG 15 STRL LF DISP TIS (BLADE) IMPLANT
BLADE SURG 15 STRL SS (BLADE) ×2
BLADE SURG SZ10 CARB STEEL (BLADE) ×2 IMPLANT
CLOTH BEACON ORANGE TIMEOUT ST (SAFETY) ×2 IMPLANT
COVER LIGHT HANDLE STERIS (MISCELLANEOUS) ×4 IMPLANT
DRAPE ORTHO 2.5IN SPLIT 77X108 (DRAPES) IMPLANT
DRAPE ORTHO SPLIT 77X108 STRL (DRAPES) ×2
DRESSING ALLEVYN LIFE SACRUM (GAUZE/BANDAGES/DRESSINGS) ×1 IMPLANT
DURAPREP 26ML APPLICATOR (WOUND CARE) ×2 IMPLANT
ELECT REM PT RETURN 9FT ADLT (ELECTROSURGICAL) ×2
ELECTRODE REM PT RTRN 9FT ADLT (ELECTROSURGICAL) IMPLANT
FORMALIN 10 PREFIL 120ML (MISCELLANEOUS) ×1 IMPLANT
GAUZE SPONGE 4X4 12PLY STRL (GAUZE/BANDAGES/DRESSINGS) ×1 IMPLANT
GAUZE XEROFORM 5X9 LF (GAUZE/BANDAGES/DRESSINGS) ×1 IMPLANT
GLOVE BIO SURGEON STRL SZ8 (GLOVE) ×1 IMPLANT
GLOVE BIO SURGEON STRL SZ8.5 (GLOVE) ×2 IMPLANT
GLOVE BIOGEL PI IND STRL 7.0 (GLOVE) IMPLANT
GLOVE BIOGEL PI INDICATOR 7.0 (GLOVE) ×2
GLOVE ECLIPSE 6.5 STRL STRAW (GLOVE) ×2 IMPLANT
GLOVE EXAM NITRILE LRG STRL (GLOVE) ×2 IMPLANT
GOWN STRL REUS W/TWL LRG LVL3 (GOWN DISPOSABLE) ×2 IMPLANT
GOWN STRL REUS W/TWL XL LVL3 (GOWN DISPOSABLE) ×2 IMPLANT
INST SET MINOR BONE (KITS) ×1 IMPLANT
KIT ROOM TURNOVER APOR (KITS) ×1 IMPLANT
NS IRRIG 1000ML POUR BTL (IV SOLUTION) ×4 IMPLANT
PACK ABDOMINAL MAJOR (CUSTOM PROCEDURE TRAY) ×1 IMPLANT
PACK TOTAL JOINT (CUSTOM PROCEDURE TRAY) ×2 IMPLANT
PAD ABD 5X9 TENDERSORB (GAUZE/BANDAGES/DRESSINGS) ×1 IMPLANT
PAD ARMBOARD 7.5X6 YLW CONV (MISCELLANEOUS) ×2 IMPLANT
SET BASIN LINEN APH (SET/KITS/TRAYS/PACK) ×1 IMPLANT
STAPLER VISISTAT 35W (STAPLE) ×1 IMPLANT
SUT PLAIN 2 0 XLH (SUTURE) ×2 IMPLANT
SWAB CULTURE LIQ STUART DBL (MISCELLANEOUS) ×1 IMPLANT
TAPE MEDIFIX FOAM 3 (GAUZE/BANDAGES/DRESSINGS) ×1 IMPLANT

## 2014-07-14 NOTE — Discharge Instructions (Signed)
Change left hip wound dressing daily, begin Friday.  Weight bear as tolerated.  Take pain medicine as needed.  See Dr. Hilda LiasKeeling in his office in ten days.  Call [phone] for appointment.  Call if any problem or if after hours, call hospital at [phone].

## 2014-07-14 NOTE — Transfer of Care (Signed)
Immediate Anesthesia Transfer of Care Note  Patient: [name]  Procedure(s) Performed: Procedure(s): EXCISION LEFT HIP SINUS TRACT (Left)  Patient Location: PACU  Anesthesia Type:Spinal  Level of Consciousness: awake and patient cooperative  Airway & Oxygen Therapy: Patient Spontanous Breathing and non-rebreather face mask  Post-op Assessment: Report given to PACU RN, Post -op Vital signs reviewed and stable and Patient moving all extremities  Post vital signs: Reviewed and stable  Complications: No apparent anesthesia complications

## 2014-07-14 NOTE — Op Note (Signed)
[name], [name]              ACCOUNT NO.:  [id number]  MEDICAL RECORD NO.:  [id number]  LOCATION:  [facility]                          FACILITY:  [facility]  PHYSICIAN:  J. Darreld Mclean, M.D. DATE OF BIRTH:  Jun 03, 1921  DATE OF PROCEDURE: DATE OF DISCHARGE:                              OPERATIVE REPORT   PREOPERATIVE DIAGNOSIS:  Draining wound lesion and ulceration, left hip wound.  POSTOPERATIVE DIAGNOSIS:  Sinus tract wound on left hip, superficial, not extending past the fascia layer.  PROCEDURE:  Exploration of left hip wound; excision of sinus tract, left hip wound.  ANESTHESIA:  Spinal.  SURGEON:  J. Darreld Mclean, MD.  ASSISTANT:  Barbaraann Rondo, RN.  DRAINS:  None.  ESTIMATED BLOOD LOSS:  50 mL or less.  WOUND:  Primarily closed.  INDICATIONS:  The patient is a 80 year old female, who exactly 2 years ago today, had open reduction and internal fixation of a left hip intertrochanteric fracture.  She did very well.  She was discharged to [facility] and has been there since.  She has done very well. However, in late April of this year, she developed a wound on her mid portion of the left hip.  She was seen by Dr. Malvin Johns on May 5 for evaluation.  He called me and then I saw the patient later that day.  She indeed had some drainage in the mid part of the wound.  I got a culture.  The rest of the wound looked good.  The hip x-rays looked good.  There was no signs of osteomyelitis.  There was no signs of loosening around the hip, the fracture had healed.  She had hip compression screw in good position and alignment.  She responded well to antibiotics and I thought the problem was taken care of, then it flared up again, and she had some pocket of inflammation and fluctuance and I aspirated that.  All cultures taken at multiple times have been negative.  Had on doxycycline and I put her on a second course.  She did well, stopped.  Wound looked completely  healed and then few weeks later, started having some drainage and actually a small appearing ulceration area.  She was concerned because her medical coverage did not cover the doxycycline.  So, I changed her to Keflex. She responded to that.  Wound got better and then it flared up again.  I recommended exploration and excision.  I saw her in the office just a few weeks ago and she has not been on any antibiotics.  The wound was completely healed, but there is a palpable defect in the mid part of the old surgical hip wound.  The best thing to do is to explore this and see what is going on and see if it goes to fascial layer or below.  I have explained preoperatively to both the patient and her sister who is her main power of attorney that if it does extend down past the fascial layer, we may have to take out the prosthesis that is going to be much more involved surgery, then she would be admitted and may probably be a staged surgery with several operations.  They understand.  They understand even if it does not go past fascial layer that this could reoccur.  They were prepared for admission.  They were prepared to have to go back to the nursing home. I explained of possible transfusion of blood if it was a more extensive surgery.  DESCRIPTION OF PROCEDURE:  The patient was seen in the holding area.  I again talked to the patient and her sister and went over the procedure one more time before we went back to the operating room, and they again fully understood what potential procedure could be or extensor procedure could be.  The left hip was identified as correct surgical site.  I placed a mark on the left hip.  She was brought to the operating room. She was given spinal anesthesia.  Then, placed right side down left side up decubitus position, held in place with supports.  She was prepped and draped in the usual manner.  We had a generalized time-out identifying the patient as Ms.  [name] and we are doing a left hip wound.  All instrumentation were brought into position and working.  The OR team knew each other.  Incision was made through the previous hip incision.  There was obviously discoloration of material in this one area of the wound and it appeared to be a sinus tract.  With careful dissection, I followed the sinus tract down and went down to the fascia layer around a couple of the sutures, but there was no penetration of the fascial layer at all. There was some irritation around a couple of the previously applied Brilon type sutures and I do not know why.  This is a so-called stitch abscess.  Cultures were taken.  I followed the sinus tract in the lesion and excised at all en block, and it was sent to Pathology.  There were some slight other tissue still present with some inflammation, and I reexcised that as well.  I again checked the fascial layer.  There was no defect through the fascia.  The fascia was intact.  There was no purulence present.  Once the inflammatory tissue was removed, the rest of the tissue looked very normal and looked very good.  We had no problem with hemostasis and lost very little blood.  Wound was then reapproximated using 2-0 plain and skin staples.  Sterile dressing applied.  The patient will go to recovery and then go back to nursing home later today.  I will see her in the office in approximately 10 days.  I will have her be maintained on Keflex antibiotics.          ______________________________ Shela CommonsJ. Darreld McleanWayne Tymeshia Awan, M.D.     JWK/MEDQ  D:  07/14/2014  T:  07/14/2014  Job:  [id number]  cc:   Barbaraann BarthelWilliam Bradford, M.D. Fax: [phone]

## 2014-07-14 NOTE — Anesthesia Preprocedure Evaluation (Signed)
Anesthesia Evaluation  Patient identified by MRN, date of birth, ID band Patient confused    Reviewed: Allergy & Precautions, H&P , NPO status , Patient's Chart, lab work & pertinent test results, reviewed documented beta blocker date and time , Unable to perform ROS - Chart review only  Airway Mallampati: II TM Distance: >3 FB     Dental  (+) Edentulous Upper, Edentulous Lower   Pulmonary  breath sounds clear to auscultation        Cardiovascular hypertension, Pt. on medications and Pt. on home beta blockers + CAD and + Peripheral Vascular Disease + dysrhythmias + pacemaker Rhythm:Regular Rate:Normal  Paced at VR=70   Neuro/Psych PSYCHIATRIC DISORDERS (Alzheimers dementia) Depression    GI/Hepatic PUD,   Endo/Other    Renal/GU      Musculoskeletal   Abdominal   Peds  Hematology   Anesthesia Other Findings   Reproductive/Obstetrics                           Anesthesia Physical Anesthesia Plan  ASA: III  Anesthesia Plan: Spinal   Post-op Pain Management:    Induction:   Airway Management Planned: Nasal Cannula  Additional Equipment:   Intra-op Plan:   Post-operative Plan:   Informed Consent: I have reviewed the patients History and Physical, chart, labs and discussed the procedure including the risks, benefits and alternatives for the proposed anesthesia with the patient or authorized representative who has indicated his/her understanding and acceptance.   Consent reviewed with POA  Plan Discussed with:   Anesthesia Plan Comments:         Anesthesia Quick Evaluation

## 2014-07-14 NOTE — Anesthesia Postprocedure Evaluation (Signed)
  Anesthesia Post-op Note  Patient: Katherine NossSadie S Gazzola  Procedure(s) Performed: Procedure(s): EXCISION LEFT HIP SINUS TRACT (Left)  Patient Location: PACU  Anesthesia Type:Spinal  Level of Consciousness: awake and patient cooperative  Airway and Oxygen Therapy: Patient Spontanous Breathing and non-rebreather face mask  Post-op Pain: none  Post-op Assessment: Post-op Vital signs reviewed, Patient's Cardiovascular Status Stable and Respiratory Function Stable  Post-op Vital Signs: Reviewed and stable  Last Vitals:  Filed Vitals:   07/14/14 0855  BP: 133/56  Pulse: 70  Temp: 36.4 C  Resp: 16    Complications: No apparent anesthesia complications

## 2014-07-14 NOTE — Anesthesia Procedure Notes (Signed)
Procedure Name: MAC Date/Time: 07/14/2014 7:29 AM Performed by: Franco NonesYATES, Meta Kroenke S Pre-anesthesia Checklist: Patient identified, Emergency Drugs available, Suction available, Timeout performed and Patient being monitored Patient Re-evaluated:Patient Re-evaluated prior to inductionOxygen Delivery Method: Non-rebreather mask    Spinal  Patient location during procedure: OR Start time: 07/14/2014 7:42 AM End time: 07/14/2014 7:46 AM Staffing CRNA/Resident: Minerva AreolaYATES, Daritza Brees S Preanesthetic Checklist Completed: patient identified, site marked, surgical consent, pre-op evaluation, timeout performed, IV checked, risks and benefits discussed and monitors and equipment checked Spinal Block Patient position: left lateral decubitus Prep: Betadine Patient monitoring: heart rate, cardiac monitor, continuous pulse ox and blood pressure Approach: left paramedian Location: L3-4 Injection technique: single-shot Needle Needle type: Spinocan  Needle gauge: 22 G Needle length: 9 cm Assessment Sensory level: T6 Additional Notes Betadine prep x3 1% lidocaine skinwheal  Clear CSF pre and post injection  ATTEMPTS:1 TRAY ID: [id number] TRAY EXPIRATION DATE: 2016-06

## 2014-07-14 NOTE — Brief Op Note (Signed)
07/14/2014  8:48 AM  PATIENT:  [name]  80 y.o. female  PRE-OPERATIVE DIAGNOSIS:  left hip wound ulceration  POST-OPERATIVE DIAGNOSIS:  left hip wound sinus tract  PROCEDURE:  Procedure(s): EXCISION LEFT HIP SINUS TRACT (Left)   SURGEON:  Surgeon(s) and Role:    * Darreld McleanWayne Toniyah Dilmore, MD - Primary  PHYSICIAN ASSISTANT:   ASSISTANTS: D. [place], RN   ANESTHESIA:   spinal  EBL:  Total I/O In: 800 [I.V.:800] Out: 10 [Blood:10]  BLOOD ADMINISTERED:none  DRAINS: none   LOCAL MEDICATIONS USED:  NONE  SPECIMEN:  Source of Specimen:  left hip wound sinus tract and associated tissues  DISPOSITION OF SPECIMEN:  PATHOLOGY  COUNTS:  YES  TOURNIQUET:  * No tourniquets in log *  DICTATION: .Other Dictation: Dictation Number [id number]  PLAN OF CARE: Discharge to home after PACU  PATIENT DISPOSITION:  PACU - hemodynamically stable.   Delay start of Pharmacological VTE agent (>24hrs) due to surgical blood loss or risk of bleeding: not applicable

## 2014-07-14 NOTE — Progress Notes (Signed)
The History and Physical is unchanged. I have examined the patient. The patient is medically able to have surgery on the left hip area . Katherine McleanKEELING,Roya Gieselman

## 2014-07-15 ENCOUNTER — Encounter (HOSPITAL_COMMUNITY): Payer: Self-pay | Admitting: Orthopaedic Surgery

## 2014-07-17 LAB — WOUND CULTURE: Culture: NO GROWTH

## 2014-07-20 LAB — ANAEROBIC CULTURE

## 2014-07-22 ENCOUNTER — Encounter: Payer: Self-pay | Admitting: Internal Medicine

## 2014-07-22 ENCOUNTER — Ambulatory Visit (INDEPENDENT_AMBULATORY_CARE_PROVIDER_SITE_OTHER): Payer: Medicare Other | Admitting: *Deleted

## 2014-07-22 DIAGNOSIS — I442 Atrioventricular block, complete: Secondary | ICD-10-CM

## 2014-07-22 LAB — MDC_IDC_ENUM_SESS_TYPE_INCLINIC
Battery Remaining Longevity: 10 mo
Battery Voltage: 2.68 V
Date Time Interrogation Session: 20150807143006
Lead Channel Impedance Value: 454 Ohm
Lead Channel Impedance Value: 476 Ohm
Lead Channel Pacing Threshold Amplitude: 0.375 V
Lead Channel Pacing Threshold Amplitude: 0.625 V
Lead Channel Setting Pacing Amplitude: 2 V
Lead Channel Setting Pacing Pulse Width: 0.4 ms
Lead Channel Setting Sensing Sensitivity: 2.8 mV
MDC IDC MSMT BATTERY IMPEDANCE: 3384 Ohm
MDC IDC MSMT LEADCHNL RA PACING THRESHOLD PULSEWIDTH: 0.4 ms
MDC IDC MSMT LEADCHNL RV PACING THRESHOLD PULSEWIDTH: 0.4 ms
MDC IDC SET LEADCHNL RV PACING AMPLITUDE: 2.5 V
MDC IDC STAT BRADY AP VP PERCENT: 98 %
MDC IDC STAT BRADY AP VS PERCENT: 0 %
MDC IDC STAT BRADY AS VP PERCENT: 2 %
MDC IDC STAT BRADY AS VS PERCENT: 0 %

## 2014-07-22 NOTE — Progress Notes (Signed)
Nursing home visit for battery longevity.  Estimated longevity <2-18 months with an average of 10 months.

## 2014-08-26 ENCOUNTER — Ambulatory Visit (INDEPENDENT_AMBULATORY_CARE_PROVIDER_SITE_OTHER): Payer: Medicare Other | Admitting: *Deleted

## 2014-08-26 DIAGNOSIS — I442 Atrioventricular block, complete: Secondary | ICD-10-CM

## 2014-08-26 LAB — MDC_IDC_ENUM_SESS_TYPE_INCLINIC
Battery Remaining Longevity: 8 mo
Brady Statistic AP VP Percent: 100 %
Brady Statistic AP VS Percent: 0 %
Brady Statistic AS VP Percent: 0 %
Date Time Interrogation Session: 20150911124544
Lead Channel Impedance Value: 459 Ohm
Lead Channel Impedance Value: 462 Ohm
Lead Channel Pacing Threshold Amplitude: 0.75 V
Lead Channel Pacing Threshold Pulse Width: 0.4 ms
Lead Channel Sensing Intrinsic Amplitude: 0.25 mV
Lead Channel Setting Pacing Amplitude: 2 V
MDC IDC MSMT BATTERY IMPEDANCE: 3665 Ohm
MDC IDC MSMT BATTERY VOLTAGE: 2.67 V
MDC IDC MSMT LEADCHNL RV PACING THRESHOLD AMPLITUDE: 0.75 V
MDC IDC MSMT LEADCHNL RV PACING THRESHOLD PULSEWIDTH: 0.4 ms
MDC IDC SET LEADCHNL RV PACING AMPLITUDE: 2.5 V
MDC IDC SET LEADCHNL RV PACING PULSEWIDTH: 0.4 ms
MDC IDC SET LEADCHNL RV SENSING SENSITIVITY: 2.8 mV
MDC IDC STAT BRADY AS VS PERCENT: 0 %

## 2014-08-26 NOTE — Progress Notes (Signed)
PPM check in nursing home.

## 2014-09-06 ENCOUNTER — Encounter: Payer: Self-pay | Admitting: Internal Medicine

## 2014-09-23 ENCOUNTER — Encounter: Payer: Self-pay | Admitting: Internal Medicine

## 2014-09-23 ENCOUNTER — Ambulatory Visit (INDEPENDENT_AMBULATORY_CARE_PROVIDER_SITE_OTHER): Payer: Medicare Other | Admitting: *Deleted

## 2014-09-23 DIAGNOSIS — I442 Atrioventricular block, complete: Secondary | ICD-10-CM

## 2014-09-23 LAB — MDC_IDC_ENUM_SESS_TYPE_INCLINIC
Brady Statistic AP VS Percent: 0 %
Brady Statistic AS VP Percent: 0 %
Brady Statistic AS VS Percent: 0 %
Lead Channel Impedance Value: 503 Ohm
Lead Channel Pacing Threshold Amplitude: 0.75 V
Lead Channel Pacing Threshold Amplitude: 0.75 V
Lead Channel Pacing Threshold Pulse Width: 0.4 ms
Lead Channel Sensing Intrinsic Amplitude: 0.5 mV
Lead Channel Setting Pacing Amplitude: 2 V
Lead Channel Setting Pacing Amplitude: 2.5 V
MDC IDC MSMT BATTERY IMPEDANCE: 3814 Ohm
MDC IDC MSMT BATTERY REMAINING LONGEVITY: 8 mo
MDC IDC MSMT BATTERY VOLTAGE: 2.66 V
MDC IDC MSMT LEADCHNL RA IMPEDANCE VALUE: 461 Ohm
MDC IDC MSMT LEADCHNL RV PACING THRESHOLD PULSEWIDTH: 0.4 ms
MDC IDC SESS DTM: 20151009102000
MDC IDC SET LEADCHNL RV PACING PULSEWIDTH: 0.4 ms
MDC IDC SET LEADCHNL RV SENSING SENSITIVITY: 2.8 mV
MDC IDC STAT BRADY AP VP PERCENT: 100 %

## 2014-09-23 NOTE — Progress Notes (Signed)
Pacemaker check in SNF ([facility]). Normal device function. F/U in 1 month for ppm check.

## 2014-10-21 ENCOUNTER — Ambulatory Visit (INDEPENDENT_AMBULATORY_CARE_PROVIDER_SITE_OTHER): Payer: Medicare Other | Admitting: *Deleted

## 2014-10-21 DIAGNOSIS — I442 Atrioventricular block, complete: Secondary | ICD-10-CM

## 2014-10-21 LAB — MDC_IDC_ENUM_SESS_TYPE_INCLINIC
Battery Impedance: 4231 Ohm
Battery Voltage: 2.65 V
Brady Statistic AP VP Percent: 99 %
Brady Statistic AP VS Percent: 0 %
Brady Statistic AS VP Percent: 1 %
Brady Statistic AS VS Percent: 0 %
Lead Channel Impedance Value: 442 Ohm
Lead Channel Impedance Value: 446 Ohm
Lead Channel Pacing Threshold Amplitude: 0.5 V
Lead Channel Pacing Threshold Pulse Width: 0.4 ms
Lead Channel Setting Pacing Amplitude: 2.5 V
Lead Channel Setting Pacing Pulse Width: 0.4 ms
Lead Channel Setting Sensing Sensitivity: 2.8 mV
MDC IDC MSMT BATTERY REMAINING LONGEVITY: 5 mo
MDC IDC MSMT LEADCHNL RV PACING THRESHOLD AMPLITUDE: 0.625 V
MDC IDC MSMT LEADCHNL RV PACING THRESHOLD PULSEWIDTH: 0.4 ms
MDC IDC SESS DTM: 20151106082109
MDC IDC SET LEADCHNL RA PACING AMPLITUDE: 2 V

## 2014-11-09 ENCOUNTER — Encounter: Payer: Self-pay | Admitting: Internal Medicine

## 2014-11-24 ENCOUNTER — Ambulatory Visit (INDEPENDENT_AMBULATORY_CARE_PROVIDER_SITE_OTHER): Payer: Medicare Other | Admitting: *Deleted

## 2014-11-24 DIAGNOSIS — I442 Atrioventricular block, complete: Secondary | ICD-10-CM

## 2014-11-24 LAB — MDC_IDC_ENUM_SESS_TYPE_INCLINIC
Battery Impedance: 4697 Ohm
Brady Statistic AP VP Percent: 100 %
Brady Statistic AP VS Percent: 0 %
Brady Statistic AS VS Percent: 0 %
Date Time Interrogation Session: 20151210075625
Lead Channel Impedance Value: 451 Ohm
Lead Channel Setting Pacing Amplitude: 2 V
Lead Channel Setting Pacing Amplitude: 2.5 V
Lead Channel Setting Pacing Pulse Width: 0.4 ms
MDC IDC MSMT BATTERY REMAINING LONGEVITY: 3 mo
MDC IDC MSMT BATTERY VOLTAGE: 2.63 V
MDC IDC MSMT LEADCHNL RV IMPEDANCE VALUE: 445 Ohm
MDC IDC SET LEADCHNL RV SENSING SENSITIVITY: 2.8 mV
MDC IDC STAT BRADY AS VP PERCENT: 0 %

## 2014-11-24 NOTE — Progress Notes (Signed)
PPM battery check only. 

## 2014-12-02 ENCOUNTER — Encounter: Payer: Self-pay | Admitting: Internal Medicine

## 2015-01-11 ENCOUNTER — Ambulatory Visit (INDEPENDENT_AMBULATORY_CARE_PROVIDER_SITE_OTHER): Payer: Medicare Other | Admitting: *Deleted

## 2015-01-11 DIAGNOSIS — I442 Atrioventricular block, complete: Secondary | ICD-10-CM

## 2015-01-11 LAB — MDC_IDC_ENUM_SESS_TYPE_INCLINIC
Battery Impedance: 5273 Ohm
Battery Remaining Longevity: 2 mo
Brady Statistic AP VS Percent: 0 %
Brady Statistic AS VS Percent: 0 %
Lead Channel Impedance Value: 484 Ohm
Lead Channel Impedance Value: 487 Ohm
Lead Channel Pacing Threshold Amplitude: 0.75 V
Lead Channel Sensing Intrinsic Amplitude: 0.35 mV
Lead Channel Setting Pacing Amplitude: 2 V
Lead Channel Setting Pacing Amplitude: 2.5 V
Lead Channel Setting Sensing Sensitivity: 2.8 mV
MDC IDC MSMT BATTERY VOLTAGE: 2.6 V
MDC IDC MSMT LEADCHNL RA PACING THRESHOLD AMPLITUDE: 0.5 V
MDC IDC MSMT LEADCHNL RA PACING THRESHOLD PULSEWIDTH: 0.4 ms
MDC IDC MSMT LEADCHNL RV PACING THRESHOLD PULSEWIDTH: 0.4 ms
MDC IDC SESS DTM: 20160127105608
MDC IDC SET LEADCHNL RV PACING PULSEWIDTH: 0.4 ms
MDC IDC STAT BRADY AP VP PERCENT: 98 %
MDC IDC STAT BRADY AS VP PERCENT: 1 %

## 2015-01-11 NOTE — Progress Notes (Signed)
Pacemaker check in clinic. Battery longevity 2 months with range of 1 to 8 months. 5 mode switch episodes-- all less than 1 minute. No ventricular high rate episodes. No changes made. Next check 02-24-15 for battery check. Pt to be checked at [facility] room A21. Next billable April.

## 2015-01-19 ENCOUNTER — Encounter: Payer: Self-pay | Admitting: Internal Medicine

## 2015-02-24 ENCOUNTER — Ambulatory Visit (INDEPENDENT_AMBULATORY_CARE_PROVIDER_SITE_OTHER): Payer: Medicare Other | Admitting: *Deleted

## 2015-02-24 DIAGNOSIS — I442 Atrioventricular block, complete: Secondary | ICD-10-CM

## 2015-02-24 LAB — MDC_IDC_ENUM_SESS_TYPE_INCLINIC
Battery Remaining Longevity: 5 mo
Battery Voltage: 2.65 V
Brady Statistic RV Percent Paced: 100 %
Lead Channel Impedance Value: 67 Ohm
Lead Channel Setting Pacing Amplitude: 2.5 V
Lead Channel Setting Pacing Pulse Width: 0.4 ms
MDC IDC MSMT BATTERY IMPEDANCE: 5975 Ohm
MDC IDC MSMT LEADCHNL RV IMPEDANCE VALUE: 458 Ohm
MDC IDC SESS DTM: 20160311123624
MDC IDC SET LEADCHNL RV SENSING SENSITIVITY: 2.8 mV

## 2015-02-24 NOTE — Progress Notes (Signed)
Interrogation only for battery longevity.  Device has reached ERI 01/30/15 and has reverted to VVI @ 65.  We will plan to schedule an office visit to discuss change out.

## 2015-03-02 ENCOUNTER — Telehealth: Payer: Self-pay | Admitting: *Deleted

## 2015-03-02 NOTE — Telephone Encounter (Signed)
New message ° ° ° ° ° ° °Pt returning nurse call  °

## 2015-03-02 NOTE — Telephone Encounter (Signed)
Left message for sister patty regarding need for generator change. [name] - [phone](POA)

## 2015-03-03 ENCOUNTER — Other Ambulatory Visit: Payer: Self-pay | Admitting: *Deleted

## 2015-03-03 DIAGNOSIS — R001 Bradycardia, unspecified: Secondary | ICD-10-CM

## 2015-03-03 NOTE — Telephone Encounter (Signed)
Spoke with [name] (HCPOA) and [facility] to have a generator change for patient as ppm is ERI.  I went over instructions with Alexia Freestoneatty - to arrive at hospital at 1130am on 03/31/2015 [facility]. Nothing to eat or drink after midnight and she doesn't need to take any medications the morning of the procedure.  [name] states she will give all these instructions to [facility] where the patient lives. [name] states she will be there to sign the consent as the patient has dementia.

## 2015-03-20 ENCOUNTER — Encounter: Payer: Self-pay | Admitting: Internal Medicine

## 2015-03-30 DIAGNOSIS — Z45018 Encounter for adjustment and management of other part of cardiac pacemaker: Secondary | ICD-10-CM | POA: Diagnosis present

## 2015-03-30 DIAGNOSIS — G309 Alzheimer's disease, unspecified: Secondary | ICD-10-CM | POA: Diagnosis not present

## 2015-03-30 DIAGNOSIS — F0280 Dementia in other diseases classified elsewhere without behavioral disturbance: Secondary | ICD-10-CM | POA: Diagnosis not present

## 2015-03-30 DIAGNOSIS — M810 Age-related osteoporosis without current pathological fracture: Secondary | ICD-10-CM | POA: Diagnosis not present

## 2015-03-30 DIAGNOSIS — I10 Essential (primary) hypertension: Secondary | ICD-10-CM | POA: Diagnosis not present

## 2015-03-30 DIAGNOSIS — I442 Atrioventricular block, complete: Secondary | ICD-10-CM | POA: Diagnosis not present

## 2015-03-30 MED ORDER — CHLORHEXIDINE GLUCONATE 4 % EX LIQD
60.0000 mL | Freq: Once | CUTANEOUS | Status: DC
  Filled 2015-03-30: qty 60

## 2015-03-30 MED ORDER — SODIUM CHLORIDE 0.9 % IR SOLN
80.0000 mg | Status: AC
  Filled 2015-03-30: qty 2

## 2015-03-30 MED ORDER — MUPIROCIN 2 % EX OINT
1.0000 "application " | TOPICAL_OINTMENT | Freq: Once | CUTANEOUS | Status: DC
  Filled 2015-03-30: qty 22

## 2015-03-30 MED ORDER — SODIUM CHLORIDE 0.9 % IV SOLN
INTRAVENOUS | Status: DC
  Administered 2015-03-31: 13:00:00 via INTRAVENOUS

## 2015-03-30 MED ORDER — CEFAZOLIN SODIUM-DEXTROSE 2-3 GM-% IV SOLR: 2.0000 g | INTRAVENOUS | Status: AC

## 2015-03-31 ENCOUNTER — Encounter (HOSPITAL_COMMUNITY): Payer: Self-pay | Admitting: Internal Medicine

## 2015-03-31 ENCOUNTER — Ambulatory Visit (HOSPITAL_COMMUNITY)
Admission: RE | Admit: 2015-03-31 | Discharge: 2015-03-31 | Disposition: A | Discharge status: Home / Self Care | Payer: Medicare Other | Source: Ambulatory Visit | Attending: Internal Medicine | Admitting: Internal Medicine

## 2015-03-31 ENCOUNTER — Encounter (HOSPITAL_COMMUNITY)
Admission: RE | Disposition: A | Discharge status: Home / Self Care | Payer: Self-pay | Source: Ambulatory Visit | Attending: Internal Medicine

## 2015-03-31 DIAGNOSIS — Z45010 Encounter for checking and testing of cardiac pacemaker pulse generator [battery]: Secondary | ICD-10-CM

## 2015-03-31 DIAGNOSIS — I10 Essential (primary) hypertension: Secondary | ICD-10-CM | POA: Insufficient documentation

## 2015-03-31 DIAGNOSIS — G309 Alzheimer's disease, unspecified: Secondary | ICD-10-CM | POA: Insufficient documentation

## 2015-03-31 DIAGNOSIS — F0280 Dementia in other diseases classified elsewhere without behavioral disturbance: Secondary | ICD-10-CM | POA: Insufficient documentation

## 2015-03-31 DIAGNOSIS — Z45018 Encounter for adjustment and management of other part of cardiac pacemaker: Secondary | ICD-10-CM | POA: Insufficient documentation

## 2015-03-31 DIAGNOSIS — I442 Atrioventricular block, complete: Secondary | ICD-10-CM | POA: Insufficient documentation

## 2015-03-31 DIAGNOSIS — R001 Bradycardia, unspecified: Secondary | ICD-10-CM

## 2015-03-31 DIAGNOSIS — M810 Age-related osteoporosis without current pathological fracture: Secondary | ICD-10-CM | POA: Insufficient documentation

## 2015-03-31 HISTORY — PX: PACEMAKER GENERATOR CHANGE: SHX5481

## 2015-03-31 LAB — CBC
HEMATOCRIT: 38.5 % (ref 36.0–46.0)
Hemoglobin: 12.5 g/dL (ref 12.0–15.0)
MCH: 29.1 pg (ref 26.0–34.0)
MCHC: 32.5 g/dL (ref 30.0–36.0)
MCV: 89.5 fL (ref 78.0–100.0)
Platelets: 126 10*3/uL — ABNORMAL LOW (ref 150–400)
RBC: 4.3 MIL/uL (ref 3.87–5.11)
RDW: 14.9 % (ref 11.5–15.5)
WBC: 8.1 10*3/uL (ref 4.0–10.5)

## 2015-03-31 LAB — BASIC METABOLIC PANEL
Anion gap: 7 (ref 5–15)
BUN: 28 mg/dL — AB (ref 6–23)
CALCIUM: 9.6 mg/dL (ref 8.4–10.5)
CHLORIDE: 105 mmol/L (ref 96–112)
CO2: 27 mmol/L (ref 19–32)
CREATININE: 1.09 mg/dL (ref 0.50–1.10)
GFR calc Af Amer: 50 mL/min — ABNORMAL LOW (ref >90)
GFR, EST NON AFRICAN AMERICAN: 43 mL/min — AB (ref >90)
Glucose, Bld: 97 mg/dL (ref 70–99)
Potassium: 4 mmol/L (ref 3.5–5.1)
Sodium: 139 mmol/L (ref 135–145)

## 2015-03-31 LAB — SURGICAL PCR SCREEN
MRSA, PCR: NEGATIVE
Staphylococcus aureus: NEGATIVE

## 2015-03-31 SURGERY — PACEMAKER GENERATOR CHANGE
Anesthesia: LOCAL

## 2015-03-31 MED ORDER — MIDAZOLAM HCL 5 MG/5ML IJ SOLN
INTRAMUSCULAR | Status: AC
  Filled 2015-03-31: qty 5

## 2015-03-31 MED ORDER — CEFAZOLIN SODIUM-DEXTROSE 2-3 GM-% IV SOLR
INTRAVENOUS | Status: AC
  Filled 2015-03-31: qty 50

## 2015-03-31 MED ORDER — ACETAMINOPHEN 325 MG PO TABS
325.0000 mg | ORAL_TABLET | ORAL | Status: DC | PRN
  Filled 2015-03-31: qty 2

## 2015-03-31 MED ORDER — FENTANYL CITRATE (PF) 100 MCG/2ML IJ SOLN: 25.0000 ug | INTRAMUSCULAR | Status: DC | PRN

## 2015-03-31 MED ORDER — LIDOCAINE HCL (PF) 1 % IJ SOLN
INTRAMUSCULAR | Status: AC
  Filled 2015-03-31: qty 60

## 2015-03-31 MED ORDER — FENTANYL CITRATE (PF) 100 MCG/2ML IJ SOLN
INTRAMUSCULAR | Status: AC
  Filled 2015-03-31: qty 2

## 2015-03-31 MED ORDER — MUPIROCIN 2 % EX OINT
TOPICAL_OINTMENT | CUTANEOUS | Status: AC
  Filled 2015-03-31: qty 22

## 2015-03-31 MED ORDER — ONDANSETRON HCL 4 MG/2ML IJ SOLN
4.0000 mg | Freq: Four times a day (QID) | INTRAMUSCULAR | Status: DC | PRN
Start: 2015-03-31 — End: 2015-03-31

## 2015-03-31 NOTE — Interval H&P Note (Signed)
History and Physical Interval Note:  03/31/2015 1:35 PM  [name]  has presented today for surgery, with the diagnosis of eri  The various methods of treatment have been discussed with the patient and family. After consideration of risks, benefits and other options for treatment, the patient has consented to  Procedure(s): PACEMAKER GENERATOR CHANGE (N/A) as a surgical intervention due to complete heart block with the patient at elective replacement.  The patient's history has been reviewed, patient examined, no change in status, stable for surgery.  I have reviewed the patient's chart and labs.  Questions were answered to the patient's satisfaction.     Leonia ReevesGregg Mylah Baynes,M.D.

## 2015-03-31 NOTE — Discharge Instructions (Signed)
Pacemaker Battery Change, Care After °Refer to this sheet in the next few weeks. These instructions provide you with information on caring for yourself after your procedure. Your health care provider may also give you more specific instructions. Your treatment has been planned according to current medical practices, but problems sometimes occur. Call your health care provider if you have any problems or questions after your procedure. °WHAT TO EXPECT AFTER THE PROCEDURE °After your procedure, it is typical to have the following sensations: °· Soreness at the pacemaker site. °HOME CARE INSTRUCTIONS  °· Keep the incision clean and dry. °· Unless advised otherwise, you may shower beginning 48 hours after your procedure. °· For the first week after the replacement, avoid stretching motions that pull at the incision site, and avoid heavy exercise with the arm that is on the same side as the incision. °· Take medicines only as directed by your health care provider. °· Keep all follow-up visits as directed by your health care provider. °SEEK MEDICAL CARE IF:  °· You have pain at the incision site that is not relieved by over-the-counter or prescription medicine. °· There is drainage or pus from the incision site. °· There is swelling larger than a lime at the incision site. °· You develop red streaking that extends above or below the incision site. °· You feel brief, intermittent palpitations, light-headedness, or any symptoms that you feel might be related to your heart. °SEEK IMMEDIATE MEDICAL CARE IF:  °· You experience chest pain that is different than the pain at the pacemaker site. °· You experience shortness of breath. °· You have palpitations or irregular heartbeat. °· You have light-headedness that does not go away quickly. °· You faint. °· You have pain that gets worse and is not relieved by medicine. °Document Released: 09/22/2013 Document Revised: 04/18/2014 Document Reviewed: 09/22/2013 °Exit[employer]® Patient  Information ©2015 [employer]. This information is not intended to replace advice given to you by your health care provider. Make sure you discuss any questions you have with your health care provider. ° °

## 2015-03-31 NOTE — CV Procedure (Signed)
EP Procedure Note  Procedure: PM generator removal and insertion of a new PPM with relocation of the skin pocket to a subpectoral location  Pre-procedure diagnosis: complete heart block, [facility]  Post-procedure diagnosis: same as pre-procedure diagnosis  Description of the procedure:  After informed consent was obtained, the patient was taken to the diagnostic EP lab in the fasting state. After the usual preparation draping, intervenous fentanyl was used for sedation. 30 cc of lidocaine was infiltrated into the left infraclavicular region. A 5 cm incision was carried out, and electrocautery was utilized to dissect down to the pacemaker pocket. The pocket was opened and the generator removal with gentle traction. The old Medtronic dual-chamber pacemaker was removed, and the new Medtronic dual-chamber pacemaker, [place], serial number [id number], was connected to the old atrial and ventricular pacing leads. At this point, additional subcutaneous lidocaine was infiltrated into the pectoralis major muscle. Utilizing blunt dissection and electrocautery, the pectoralis major was dissected free down to the pectoralis minor. A subpectoral pocket was fashioned with a combination of blunt dissection and electrocautery. The new Medtronic dual-chamber pacemaker and leads were placed into the subpectoral pocket. The muscle was closed with Vicryl suture, and the deep tissue closed with Vicryl suture. The skin was closed with Vicryl suture as well. Electrocautery was utilized to assure hemostasis. Benzoin and Steri-Strips her pain on the skin. Pressure was held and the patient was returned to her room in satisfactory condition.  Complications: There were no immediate procedural consultations  Conclusion: Successful pacemaker removal and insertion of a new dual-chamber pacemaker with relocation of the subcutaneous pocket to a subpectoral pocket in a patient with underlying complete heart  block who had reached elective replacement on her old dual-chamber pacemaker.  Lewayne BuntingGregg Dedrick Heffner, M.D.

## 2015-03-31 NOTE — Progress Notes (Signed)
Report [facility] to [facility]

## 2015-03-31 NOTE — H&P (Signed)
HPI [name] returns today for followup. She is a very pleasant 81 year old woman with a history of symptomatic bradycardia secondary to complete heart block, hypertension, and Alzheimer's dementia. In the interim, she has been stable from an arrhythmia perspective. Unfortunately, her dementia has worsened and she sundowns quite severely. The patient admits to bad memory but denies chest pain or shortness of breath. Her sister who is with her today confirms this history. She states that she is eating well. She has reached ERI on her PPM and presents today for PM generator removal and insertion of a new PPM. No Known Allergies   Current Outpatient Prescriptions  Medication Sig Dispense Refill  . albuterol (PROVENTIL) (5 MG/ML) 0.5% nebulizer solution Take 2.5 mg by nebulization every 6 (six) hours as needed for wheezing or shortness of breath.    . cephALEXin (KEFLEX) 500 MG capsule Take 500 mg by mouth 4 (four) times daily.    . citalopram (CELEXA) 10 MG tablet Take 10 mg by mouth daily.    Marland Kitchen docusate sodium (COLACE) 100 MG capsule Take 100 mg by mouth 2 (two) times daily.     . furosemide (LASIX) 20 MG tablet Take 20 mg by mouth daily.     . metoprolol tartrate (LOPRESSOR) 25 MG tablet Take 25 mg by mouth 2 (two) times daily.    . naproxen (NAPROSYN) 500 MG tablet 2 (two) times daily.    Marland Kitchen omeprazole (PRILOSEC) 20 MG capsule Take 20 mg by mouth daily.     . potassium chloride SA (K-DUR,KLOR-CON) 20 MEQ tablet Take 20 mEq by mouth daily.    . promethazine (PHENERGAN) 12.5 MG tablet Take 12.5 mg by mouth every 6 (six) hours as needed for nausea.    Marland Kitchen senna (SENOKOT) 8.6 MG TABS Take 2 tablets by mouth.    . simvastatin (ZOCOR) 20 MG tablet Take 20 mg by mouth daily.    Marland Kitchen albuterol (PROVENTIL) (5 MG/ML) 0.5% nebulizer solution Take 0.5 mLs (2.5 mg total) by nebulization every 4 (four) hours as needed  for wheezing or shortness of breath. 20 mL 1   No current facility-administered medications for this visit.     Past Medical History  Diagnosis Date  . Alzheimer disease   . PUD (peptic ulcer disease) 01/14/2011    pyloric perforation, Dr . Lovell Sheehan  . H. pylori infection   . HTN (hypertension)   . Hypophosphatemia   . Hypoalbuminemia   . Neuropathy   . Arthritis   . Osteoporosis     ROS:  All systems reviewed and negative except as noted in the HPI.   Past Surgical History  Procedure Laterality Date  . Appendectomy  1947  . Perforated viscus  1/30/112    gastrorrhaphy-> Dr Lovell Sheehan  . Pacemaker placement    . Orif hip fracture  07/14/2012    Procedure: OPEN REDUCTION INTERNAL FIXATION HIP; Surgeon: Darreld Mclean, MD; Location: [facility]; Service: Orthopedics; Laterality: Left;     Family History  Problem Relation Age of Onset  . Rectal cancer Sister 70  . Coronary artery disease Sister   . Brain cancer Sister   . Stroke Sister   . Alzheimer's disease Sister   . Stroke Mother   . Stroke Father   . Alzheimer's disease Mother      History   Social History  . Marital Status: Widowed    Spouse Name: N/A    Number of Children: 0  . Years of Education: N/A   Occupational History  .  retired     Designer, fashion/clothingtextiles   Social History Main Topics  . Smoking status: Never Smoker   . Smokeless tobacco: Not on file  . Alcohol Use: No  . Drug Use: No  . Sexual Activity: No   Other Topics Concern  . Not on file   Social History Narrative   [facility]   Sister, RedwoodPatty [place], #[phone] states she is POA/HCPOA           BP 105/61  Pulse 79  Ht 5\' 2"  (1.575 m)  Wt 121 lb 12.8 oz (55.248 kg)  BMI 22.27 kg/m2  Physical Exam:  Well appearing 81 year old woman,NAD HEENT:  Unremarkable Neck: No JVD, no thyromegally Back: No CVA tenderness Lungs: Clear with no wheezes, rales, or rhonchi. Well-healed pacemaker incision. HEART: Regular rate rhythm, no murmurs, no rubs, no clicks Abd: soft, positive bowel sounds, no organomegally, no rebound, no guarding Ext: 2 plus pulses, no edema, no cyanosis, no clubbing Skin: No rashes no nodules Neuro: CN II through XII intact, motor grossly intact   DEVICE  Normal device function. See PaceArt for details.   Assess/Plan:            CARDIAC PACEMAKER IN SITU     Status: Written Related Problem: CARDIAC PACEMAKER IN SITU   Expand All Collapse All   Her Medtronic DDD PM is working normally. Will recheck in several months. She has reached ERI.            EDEMA -    Status: Written Related Problem: EDEMA   Expand All Collapse All   She remains euvolemic on exam. I have asked the patient to reduce her sodium intake       [name],M.D.

## 2015-04-12 ENCOUNTER — Encounter: Payer: Medicare Other | Admitting: Internal Medicine

## 2015-04-12 ENCOUNTER — Ambulatory Visit (INDEPENDENT_AMBULATORY_CARE_PROVIDER_SITE_OTHER): Payer: Medicare Other | Admitting: *Deleted

## 2015-04-12 ENCOUNTER — Ambulatory Visit: Payer: Medicare Other

## 2015-04-12 DIAGNOSIS — Z950 Presence of cardiac pacemaker: Secondary | ICD-10-CM

## 2015-04-12 LAB — MDC_IDC_ENUM_SESS_TYPE_INCLINIC
Battery Impedance: 100 Ohm
Battery Remaining Longevity: 120 mo
Battery Voltage: 2.79 V
Brady Statistic AP VP Percent: 91 %
Brady Statistic AP VS Percent: 0 %
Date Time Interrogation Session: 20160427114454
Lead Channel Impedance Value: 469 Ohm
Lead Channel Impedance Value: 478 Ohm
Lead Channel Pacing Threshold Amplitude: 0.5 V
Lead Channel Pacing Threshold Amplitude: 0.75 V
Lead Channel Pacing Threshold Pulse Width: 0.4 ms
Lead Channel Sensing Intrinsic Amplitude: 0.7 mV
Lead Channel Setting Pacing Amplitude: 1.75 V
MDC IDC MSMT LEADCHNL RA PACING THRESHOLD PULSEWIDTH: 0.4 ms
MDC IDC SET LEADCHNL RV PACING AMPLITUDE: 2 V
MDC IDC SET LEADCHNL RV PACING PULSEWIDTH: 0.4 ms
MDC IDC SET LEADCHNL RV SENSING SENSITIVITY: 2.8 mV
MDC IDC STAT BRADY AS VP PERCENT: 8 %
MDC IDC STAT BRADY AS VS PERCENT: 1 %

## 2015-04-12 NOTE — Progress Notes (Signed)
Pacemaker wound check. Steri-strips removed. Wound well healed, approximated wound edges. No redness, swelling or drainage. Small skin irritation about incision r/t steri-strips.  Normal device function.  See PaceArt for full report.  ROV with GT in RDS in July.  Acie FredricksonEmma Zyaira Vejar, RN

## 2015-05-11 ENCOUNTER — Encounter: Payer: Self-pay | Admitting: Internal Medicine

## 2015-07-11 ENCOUNTER — Encounter: Payer: Self-pay | Admitting: *Deleted

## 2015-08-11 ENCOUNTER — Encounter: Payer: Self-pay | Admitting: *Deleted

## 2015-09-13 ENCOUNTER — Encounter: Payer: Self-pay | Admitting: *Deleted

## 2015-10-17 ENCOUNTER — Encounter: Payer: Self-pay | Admitting: *Deleted

## 2015-11-13 ENCOUNTER — Encounter: Payer: Self-pay | Admitting: *Deleted

## 2016-01-04 ENCOUNTER — Ambulatory Visit (INDEPENDENT_AMBULATORY_CARE_PROVIDER_SITE_OTHER): Payer: Medicare Other | Admitting: Internal Medicine

## 2016-01-04 ENCOUNTER — Encounter: Payer: Self-pay | Admitting: Internal Medicine

## 2016-01-04 VITALS — BP 90/58 | HR 56 | Ht 61.0 in | Wt 113.0 lb

## 2016-01-04 DIAGNOSIS — Z950 Presence of cardiac pacemaker: Secondary | ICD-10-CM | POA: Diagnosis not present

## 2016-01-04 DIAGNOSIS — I10 Essential (primary) hypertension: Secondary | ICD-10-CM | POA: Diagnosis not present

## 2016-01-04 LAB — CUP PACEART INCLINIC DEVICE CHECK
Battery Voltage: 2.79 V
Brady Statistic AP VS Percent: 1 %
Brady Statistic AS VP Percent: 2 %
Brady Statistic AS VS Percent: 1 %
Date Time Interrogation Session: 20170119103600
Implantable Lead Implant Date: 20090102
Implantable Lead Implant Date: 20090102
Implantable Lead Model: 5076
Lead Channel Impedance Value: 509 Ohm
Lead Channel Pacing Threshold Amplitude: 0.5 V
Lead Channel Pacing Threshold Amplitude: 0.625 V
Lead Channel Sensing Intrinsic Amplitude: 0.5 mV
Lead Channel Setting Pacing Amplitude: 1.5 V
Lead Channel Setting Pacing Amplitude: 2 V
Lead Channel Setting Sensing Sensitivity: 2.8 mV
MDC IDC LEAD LOCATION: 753859
MDC IDC LEAD LOCATION: 753860
MDC IDC MSMT BATTERY IMPEDANCE: 100 Ohm
MDC IDC MSMT BATTERY REMAINING LONGEVITY: 122 mo
MDC IDC MSMT LEADCHNL RA IMPEDANCE VALUE: 484 Ohm
MDC IDC MSMT LEADCHNL RA PACING THRESHOLD AMPLITUDE: 0.5 V
MDC IDC MSMT LEADCHNL RA PACING THRESHOLD AMPLITUDE: 0.5 V
MDC IDC MSMT LEADCHNL RA PACING THRESHOLD PULSEWIDTH: 0.4 ms
MDC IDC MSMT LEADCHNL RA PACING THRESHOLD PULSEWIDTH: 0.4 ms
MDC IDC MSMT LEADCHNL RV PACING THRESHOLD PULSEWIDTH: 0.4 ms
MDC IDC MSMT LEADCHNL RV PACING THRESHOLD PULSEWIDTH: 0.4 ms
MDC IDC SET LEADCHNL RV PACING PULSEWIDTH: 0.4 ms
MDC IDC STAT BRADY AP VP PERCENT: 97 %

## 2016-01-04 MED ORDER — METOPROLOL TARTRATE 25 MG PO TABS: 12.5000 mg | ORAL_TABLET | Freq: Two times a day (BID) | ORAL | Status: DC

## 2016-01-04 MED ORDER — METOPROLOL TARTRATE 25 MG PO TABS: 12.5000 mg | ORAL_TABLET | Freq: Two times a day (BID) | ORAL | Status: AC

## 2016-01-04 NOTE — Assessment & Plan Note (Signed)
Her blood pressure is low today. I have asked her to reduce her dose of metoprolol. Will follow.

## 2016-01-04 NOTE — Patient Instructions (Signed)
Your physician wants you to follow-up in: 12 Months with Dr. Ladona Ridgel. You will receive a reminder letter in the mail two months in advance. If you don't receive a letter, please call our office to schedule the follow-up appointment.  Your physician has recommended you make the following change in your medication:   Decrease Metoprolol to 12.5 Two Times Daily  If you need a refill on your cardiac medications before your next appointment, please call your pharmacy.  Thank you for choosing [facility]!

## 2016-01-04 NOTE — Progress Notes (Signed)
HPI Mrs. Plotner returns today for followup. She is a very pleasant 82 year old woman with a history of symptomatic bradycardia secondary to complete heart block, hypertension, and Alzheimer's dementia. In the interim, she has been stable from an arrhythmia perspective. Unfortunately, her dementia has worsened and she sundowns quite severely. The patient now resides in assisted living. She states that she is eating well. No Known Allergies   Current Outpatient Prescriptions  Medication Sig Dispense Refill  . acetaminophen (TYLENOL) 325 MG tablet Take 650 mg by mouth every 6 (six) hours as needed (pain).    . Cholecalciferol (VITAMIN D) 2000 UNITS tablet Take 2,000 Units by mouth daily.    . citalopram (CELEXA) 10 MG tablet Take 5 mg by mouth daily.     Marland Kitchen docusate sodium (COLACE) 100 MG capsule Take 100 mg by mouth 2 (two) times daily.      . furosemide (LASIX) 20 MG tablet Take 20 mg by mouth daily.     Marland Kitchen HYDROcodone-acetaminophen (NORCO) 5-325 MG per tablet Take 1 tablet by mouth every 4 (four) hours as needed for moderate pain. 60 tablet 0  . omeprazole (PRILOSEC) 20 MG capsule Take 20 mg by mouth daily at 6 (six) AM.     . potassium chloride SA (K-DUR,KLOR-CON) 20 MEQ tablet Take 20 mEq by mouth daily.    Marland Kitchen senna (SENOKOT) 8.6 MG TABS Take 2 tablets by mouth at bedtime.     . simvastatin (ZOCOR) 10 MG tablet Take 30 mg by mouth daily.    . simvastatin (ZOCOR) 20 MG tablet Take 30 mg by mouth at bedtime.     . metoprolol tartrate (LOPRESSOR) 25 MG tablet Take 0.5 tablets (12.5 mg total) by mouth 2 (two) times daily. 180 tablet 3   No current facility-administered medications for this visit.     Past Medical History  Diagnosis Date  . PUD (peptic ulcer disease) 01/14/2011    pyloric perforation, Dr . Lovell Sheehan  . H. pylori infection   . HTN (hypertension)   . Hypophosphatemia   . Hypoalbuminemia   . Neuropathy (HCC)   . Arthritis   . Osteoporosis   . PVD (peripheral vascular  disease) (HCC)   . Alzheimer disease     Alzheimers  . Depression   . Pacemaker     ROS:   All systems reviewed and negative except as noted in the HPI.   Past Surgical History  Procedure Laterality Date  . Appendectomy  1947  . Perforated viscus  1/30/112    gastrorrhaphy-> Dr Lovell Sheehan  . Pacemaker placement    . Orif hip fracture  07/14/2012    Procedure: OPEN REDUCTION INTERNAL FIXATION HIP;  Surgeon: Darreld Mclean, MD;  Location: [facility];  Service: Orthopedics;  Laterality: Left;  . Insert / replace / remove pacemaker  01/22/2011  . Wound exploration Left 07/14/2014    Procedure: EXCISION LEFT HIP SINUS TRACT;  Surgeon: Darreld Mclean, MD;  Location: [facility];  Service: Orthopedics;  Laterality: Left;  . Pacemaker generator change N/A 03/31/2015    Procedure: PACEMAKER GENERATOR CHANGE;  Surgeon: Marinus Maw, MD;  Location: [facility] CATH LAB;  Service: Cardiovascular;  Laterality: N/A;     Family History  Problem Relation Age of Onset  . Rectal cancer Sister 42  . Coronary artery disease Sister   . Brain cancer Sister   . Stroke Sister   . Alzheimer's disease Sister   . Stroke Mother   . Stroke Father   . Alzheimer's disease  Mother      Social History   Social History  . Marital Status: Single    Spouse Name: N/A  . Number of Children: 0  . Years of Education: N/A   Occupational History  . retired     Designer, fashion/clothing   Social History Main Topics  . Smoking status: Never Smoker   . Smokeless tobacco: Not on file  . Alcohol Use: No  . Drug Use: No  . Sexual Activity: No   Other Topics Concern  . Not on file   Social History Narrative   Lives at [facility], [place], #[phone] states she is POA/HCPOA           BP 90/58 mmHg  Pulse 56  Ht  (1.549 m)  Wt 113 lb (51.256 kg)  BMI 21.36 kg/m2  SpO2 91%  Physical Exam:  Stable, elderly appearing 82 year old woman,NAD HEENT: Unremarkable Neck:  6 cm JVD, no thyromegally Back:  No CVA  tenderness Lungs:  Clear with no wheezes, rales, or rhonchi. Well-healed pacemaker incision. HEART:  Regular rate rhythm, no murmurs, no rubs, no clicks Abd:  soft, positive bowel sounds, no organomegally, no rebound, no guarding Ext:  2 plus pulses, no edema, no cyanosis, no clubbing Skin:  No rashes no nodules Neuro:  CN II through XII intact, motor grossly intact   DEVICE  Normal device function.  See PaceArt for details.   Assess/Plan:

## 2016-01-04 NOTE — Assessment & Plan Note (Signed)
Her medtronic DDD PM is working normally. Will recheck in several months. She has no escape rhythm today.

## 2016-04-04 ENCOUNTER — Encounter: Payer: Medicare Other | Admitting: *Deleted

## 2016-04-04 ENCOUNTER — Telehealth: Payer: Self-pay | Admitting: Cardiology

## 2016-04-04 NOTE — Telephone Encounter (Signed)
Confirmed remote transmission w/ pt nurse.   

## 2016-04-05 ENCOUNTER — Encounter: Payer: Self-pay | Admitting: Cardiology

## 2016-09-23 DIAGNOSIS — Z961 Presence of intraocular lens: Secondary | ICD-10-CM | POA: Diagnosis not present

## 2016-09-23 DIAGNOSIS — H26493 Other secondary cataract, bilateral: Secondary | ICD-10-CM | POA: Diagnosis not present

## 2016-09-23 DIAGNOSIS — H524 Presbyopia: Secondary | ICD-10-CM | POA: Diagnosis not present

## 2016-10-25 DIAGNOSIS — I739 Peripheral vascular disease, unspecified: Secondary | ICD-10-CM | POA: Diagnosis not present

## 2016-10-25 DIAGNOSIS — G309 Alzheimer's disease, unspecified: Secondary | ICD-10-CM | POA: Diagnosis not present

## 2016-10-25 DIAGNOSIS — R1312 Dysphagia, oropharyngeal phase: Secondary | ICD-10-CM | POA: Diagnosis not present

## 2016-10-27 DIAGNOSIS — G309 Alzheimer's disease, unspecified: Secondary | ICD-10-CM | POA: Diagnosis not present

## 2016-10-27 DIAGNOSIS — R1312 Dysphagia, oropharyngeal phase: Secondary | ICD-10-CM | POA: Diagnosis not present

## 2016-10-27 DIAGNOSIS — I739 Peripheral vascular disease, unspecified: Secondary | ICD-10-CM | POA: Diagnosis not present

## 2016-10-30 DIAGNOSIS — G309 Alzheimer's disease, unspecified: Secondary | ICD-10-CM | POA: Diagnosis not present

## 2016-10-30 DIAGNOSIS — R1312 Dysphagia, oropharyngeal phase: Secondary | ICD-10-CM | POA: Diagnosis not present

## 2016-10-30 DIAGNOSIS — I739 Peripheral vascular disease, unspecified: Secondary | ICD-10-CM | POA: Diagnosis not present

## 2016-11-01 DIAGNOSIS — I739 Peripheral vascular disease, unspecified: Secondary | ICD-10-CM | POA: Diagnosis not present

## 2016-11-01 DIAGNOSIS — G309 Alzheimer's disease, unspecified: Secondary | ICD-10-CM | POA: Diagnosis not present

## 2016-11-01 DIAGNOSIS — R1312 Dysphagia, oropharyngeal phase: Secondary | ICD-10-CM | POA: Diagnosis not present

## 2016-11-02 DIAGNOSIS — G309 Alzheimer's disease, unspecified: Secondary | ICD-10-CM | POA: Diagnosis not present

## 2016-11-02 DIAGNOSIS — I739 Peripheral vascular disease, unspecified: Secondary | ICD-10-CM | POA: Diagnosis not present

## 2016-11-02 DIAGNOSIS — R1312 Dysphagia, oropharyngeal phase: Secondary | ICD-10-CM | POA: Diagnosis not present

## 2016-11-03 DIAGNOSIS — I739 Peripheral vascular disease, unspecified: Secondary | ICD-10-CM | POA: Diagnosis not present

## 2016-11-03 DIAGNOSIS — R1312 Dysphagia, oropharyngeal phase: Secondary | ICD-10-CM | POA: Diagnosis not present

## 2016-11-03 DIAGNOSIS — G309 Alzheimer's disease, unspecified: Secondary | ICD-10-CM | POA: Diagnosis not present

## 2016-11-05 DIAGNOSIS — R1312 Dysphagia, oropharyngeal phase: Secondary | ICD-10-CM | POA: Diagnosis not present

## 2016-11-05 DIAGNOSIS — I739 Peripheral vascular disease, unspecified: Secondary | ICD-10-CM | POA: Diagnosis not present

## 2016-11-05 DIAGNOSIS — G309 Alzheimer's disease, unspecified: Secondary | ICD-10-CM | POA: Diagnosis not present

## 2016-11-07 DIAGNOSIS — R1312 Dysphagia, oropharyngeal phase: Secondary | ICD-10-CM | POA: Diagnosis not present

## 2016-11-07 DIAGNOSIS — I739 Peripheral vascular disease, unspecified: Secondary | ICD-10-CM | POA: Diagnosis not present

## 2016-11-07 DIAGNOSIS — G309 Alzheimer's disease, unspecified: Secondary | ICD-10-CM | POA: Diagnosis not present

## 2016-11-08 DIAGNOSIS — G309 Alzheimer's disease, unspecified: Secondary | ICD-10-CM | POA: Diagnosis not present

## 2016-11-08 DIAGNOSIS — R1312 Dysphagia, oropharyngeal phase: Secondary | ICD-10-CM | POA: Diagnosis not present

## 2016-11-08 DIAGNOSIS — I739 Peripheral vascular disease, unspecified: Secondary | ICD-10-CM | POA: Diagnosis not present

## 2016-11-10 DIAGNOSIS — I739 Peripheral vascular disease, unspecified: Secondary | ICD-10-CM | POA: Diagnosis not present

## 2016-11-10 DIAGNOSIS — R1312 Dysphagia, oropharyngeal phase: Secondary | ICD-10-CM | POA: Diagnosis not present

## 2016-11-10 DIAGNOSIS — G309 Alzheimer's disease, unspecified: Secondary | ICD-10-CM | POA: Diagnosis not present

## 2016-11-11 DIAGNOSIS — I739 Peripheral vascular disease, unspecified: Secondary | ICD-10-CM | POA: Diagnosis not present

## 2016-11-11 DIAGNOSIS — R1312 Dysphagia, oropharyngeal phase: Secondary | ICD-10-CM | POA: Diagnosis not present

## 2016-11-11 DIAGNOSIS — G309 Alzheimer's disease, unspecified: Secondary | ICD-10-CM | POA: Diagnosis not present

## 2016-11-12 DIAGNOSIS — R1312 Dysphagia, oropharyngeal phase: Secondary | ICD-10-CM | POA: Diagnosis not present

## 2016-11-12 DIAGNOSIS — I739 Peripheral vascular disease, unspecified: Secondary | ICD-10-CM | POA: Diagnosis not present

## 2016-11-12 DIAGNOSIS — E785 Hyperlipidemia, unspecified: Secondary | ICD-10-CM | POA: Diagnosis not present

## 2016-11-12 DIAGNOSIS — I10 Essential (primary) hypertension: Secondary | ICD-10-CM | POA: Diagnosis not present

## 2016-11-12 DIAGNOSIS — E559 Vitamin D deficiency, unspecified: Secondary | ICD-10-CM | POA: Diagnosis not present

## 2016-11-12 DIAGNOSIS — G309 Alzheimer's disease, unspecified: Secondary | ICD-10-CM | POA: Diagnosis not present

## 2016-11-12 DIAGNOSIS — D519 Vitamin B12 deficiency anemia, unspecified: Secondary | ICD-10-CM | POA: Diagnosis not present

## 2016-11-14 DIAGNOSIS — R1312 Dysphagia, oropharyngeal phase: Secondary | ICD-10-CM | POA: Diagnosis not present

## 2016-11-14 DIAGNOSIS — G4700 Insomnia, unspecified: Secondary | ICD-10-CM | POA: Diagnosis not present

## 2016-11-14 DIAGNOSIS — E785 Hyperlipidemia, unspecified: Secondary | ICD-10-CM | POA: Diagnosis not present

## 2016-11-14 DIAGNOSIS — G309 Alzheimer's disease, unspecified: Secondary | ICD-10-CM | POA: Diagnosis not present

## 2016-11-14 DIAGNOSIS — I739 Peripheral vascular disease, unspecified: Secondary | ICD-10-CM | POA: Diagnosis not present

## 2016-11-14 DIAGNOSIS — G629 Polyneuropathy, unspecified: Secondary | ICD-10-CM | POA: Diagnosis not present

## 2016-11-14 DIAGNOSIS — N183 Chronic kidney disease, stage 3 (moderate): Secondary | ICD-10-CM | POA: Diagnosis not present

## 2016-11-18 DIAGNOSIS — G309 Alzheimer's disease, unspecified: Secondary | ICD-10-CM | POA: Diagnosis not present

## 2016-11-18 DIAGNOSIS — I739 Peripheral vascular disease, unspecified: Secondary | ICD-10-CM | POA: Diagnosis not present

## 2016-11-18 DIAGNOSIS — R1312 Dysphagia, oropharyngeal phase: Secondary | ICD-10-CM | POA: Diagnosis not present

## 2016-11-19 DIAGNOSIS — G309 Alzheimer's disease, unspecified: Secondary | ICD-10-CM | POA: Diagnosis not present

## 2016-11-19 DIAGNOSIS — R1312 Dysphagia, oropharyngeal phase: Secondary | ICD-10-CM | POA: Diagnosis not present

## 2016-11-19 DIAGNOSIS — I739 Peripheral vascular disease, unspecified: Secondary | ICD-10-CM | POA: Diagnosis not present

## 2017-01-13 DIAGNOSIS — K279 Peptic ulcer, site unspecified, unspecified as acute or chronic, without hemorrhage or perforation: Secondary | ICD-10-CM | POA: Diagnosis not present

## 2017-01-13 DIAGNOSIS — Z950 Presence of cardiac pacemaker: Secondary | ICD-10-CM | POA: Diagnosis not present

## 2017-01-13 DIAGNOSIS — K5900 Constipation, unspecified: Secondary | ICD-10-CM | POA: Diagnosis not present

## 2017-01-13 DIAGNOSIS — G301 Alzheimer's disease with late onset: Secondary | ICD-10-CM | POA: Diagnosis not present

## 2017-01-21 DIAGNOSIS — G309 Alzheimer's disease, unspecified: Secondary | ICD-10-CM | POA: Diagnosis not present

## 2017-01-21 DIAGNOSIS — G629 Polyneuropathy, unspecified: Secondary | ICD-10-CM | POA: Diagnosis not present

## 2017-01-21 DIAGNOSIS — I10 Essential (primary) hypertension: Secondary | ICD-10-CM | POA: Diagnosis not present

## 2017-01-21 DIAGNOSIS — E782 Mixed hyperlipidemia: Secondary | ICD-10-CM | POA: Diagnosis not present

## 2017-01-21 DIAGNOSIS — R600 Localized edema: Secondary | ICD-10-CM | POA: Diagnosis not present

## 2017-02-04 DIAGNOSIS — N390 Urinary tract infection, site not specified: Secondary | ICD-10-CM | POA: Diagnosis not present

## 2017-02-07 DIAGNOSIS — E782 Mixed hyperlipidemia: Secondary | ICD-10-CM | POA: Diagnosis not present

## 2017-02-10 ENCOUNTER — Encounter: Payer: Self-pay | Admitting: Internal Medicine

## 2017-02-10 ENCOUNTER — Encounter: Payer: Medicare Other | Admitting: Internal Medicine

## 2017-02-11 DIAGNOSIS — I482 Chronic atrial fibrillation: Secondary | ICD-10-CM | POA: Diagnosis not present

## 2017-02-11 DIAGNOSIS — Z0000 Encounter for general adult medical examination without abnormal findings: Secondary | ICD-10-CM | POA: Diagnosis not present

## 2017-02-11 DIAGNOSIS — G629 Polyneuropathy, unspecified: Secondary | ICD-10-CM | POA: Diagnosis not present

## 2017-02-11 DIAGNOSIS — G309 Alzheimer's disease, unspecified: Secondary | ICD-10-CM | POA: Diagnosis not present

## 2017-02-11 DIAGNOSIS — E782 Mixed hyperlipidemia: Secondary | ICD-10-CM | POA: Diagnosis not present

## 2017-02-11 DIAGNOSIS — I10 Essential (primary) hypertension: Secondary | ICD-10-CM | POA: Diagnosis not present

## 2017-02-21 ENCOUNTER — Ambulatory Visit (INDEPENDENT_AMBULATORY_CARE_PROVIDER_SITE_OTHER): Payer: Medicare Other | Admitting: *Deleted

## 2017-02-21 DIAGNOSIS — I442 Atrioventricular block, complete: Secondary | ICD-10-CM | POA: Diagnosis not present

## 2017-02-21 LAB — CUP PACEART REMOTE DEVICE CHECK
Battery Impedance: 148 Ohm
Battery Remaining Longevity: 109 mo
Brady Statistic AP VP Percent: 94 %
Brady Statistic AS VP Percent: 3 %
Brady Statistic AS VS Percent: 1 %
Date Time Interrogation Session: 20180309151805
Implantable Lead Implant Date: 20090102
Implantable Lead Implant Date: 20090102
Implantable Lead Location: 753859
Implantable Lead Location: 753860
Implantable Lead Model: 5076
Implantable Lead Model: 5076
Lead Channel Impedance Value: 445 Ohm
Lead Channel Impedance Value: 464 Ohm
Lead Channel Pacing Threshold Amplitude: 0.5 V
Lead Channel Pacing Threshold Amplitude: 0.625 V
Lead Channel Pacing Threshold Pulse Width: 0.4 ms
Lead Channel Setting Sensing Sensitivity: 2.8 mV
MDC IDC MSMT BATTERY VOLTAGE: 2.79 V
MDC IDC MSMT LEADCHNL RV PACING THRESHOLD PULSEWIDTH: 0.4 ms
MDC IDC PG IMPLANT DT: 20160415
MDC IDC SET LEADCHNL RA PACING AMPLITUDE: 1.5 V
MDC IDC SET LEADCHNL RV PACING AMPLITUDE: 2 V
MDC IDC SET LEADCHNL RV PACING PULSEWIDTH: 0.4 ms
MDC IDC STAT BRADY AP VS PERCENT: 2 %

## 2017-02-21 NOTE — Progress Notes (Signed)
Remote pacemaker transmission.   

## 2017-02-26 ENCOUNTER — Encounter: Payer: Self-pay | Admitting: Cardiology

## 2017-05-26 ENCOUNTER — Telehealth: Payer: Self-pay | Admitting: Cardiology

## 2017-05-26 ENCOUNTER — Encounter: Payer: Medicare Other | Admitting: *Deleted

## 2017-05-26 NOTE — Telephone Encounter (Signed)
Attempted to confirm remote transmission with patient. Patient not longer lives at the number provided.

## 2017-05-30 ENCOUNTER — Encounter: Payer: Self-pay | Admitting: Cardiology

## 2017-06-09 ENCOUNTER — Ambulatory Visit (INDEPENDENT_AMBULATORY_CARE_PROVIDER_SITE_OTHER): Payer: Medicare Other | Admitting: *Deleted

## 2017-06-09 DIAGNOSIS — I442 Atrioventricular block, complete: Secondary | ICD-10-CM

## 2017-06-09 NOTE — Progress Notes (Signed)
Remote pacemaker transmission.   

## 2017-06-11 ENCOUNTER — Encounter: Payer: Self-pay | Admitting: Cardiology

## 2017-06-11 LAB — CUP PACEART REMOTE DEVICE CHECK
Battery Impedance: 196 Ohm
Battery Remaining Longevity: 103 mo
Battery Voltage: 2.79 V
Brady Statistic AP VP Percent: 94 %
Brady Statistic AS VP Percent: 3 %
Brady Statistic AS VS Percent: 1 %
Implantable Lead Implant Date: 20090102
Implantable Lead Implant Date: 20090102
Implantable Lead Location: 753859
Implantable Lead Location: 753860
Implantable Lead Model: 5076
Implantable Pulse Generator Implant Date: 20160415
Lead Channel Impedance Value: 486 Ohm
Lead Channel Impedance Value: 498 Ohm
Lead Channel Pacing Threshold Amplitude: 0.5 V
Lead Channel Pacing Threshold Pulse Width: 0.4 ms
Lead Channel Setting Pacing Amplitude: 1.5 V
Lead Channel Setting Pacing Amplitude: 2 V
Lead Channel Setting Pacing Pulse Width: 0.4 ms
Lead Channel Setting Sensing Sensitivity: 2.8 mV
MDC IDC MSMT LEADCHNL RA PACING THRESHOLD PULSEWIDTH: 0.4 ms
MDC IDC MSMT LEADCHNL RV PACING THRESHOLD AMPLITUDE: 0.625 V
MDC IDC SESS DTM: 20180625115831
MDC IDC STAT BRADY AP VS PERCENT: 2 %

## 2017-09-08 ENCOUNTER — Encounter: Payer: Medicare Other | Admitting: *Deleted

## 2017-09-08 ENCOUNTER — Telehealth: Payer: Self-pay | Admitting: Cardiology

## 2017-09-08 NOTE — Telephone Encounter (Signed)
Attempted to confirm remote transmission with pt. No answer and was unable to leave a message.   

## 2017-09-12 ENCOUNTER — Encounter: Payer: Self-pay | Admitting: Cardiology

## 2018-10-08 NOTE — Progress Notes (Signed)
Letter  

## 2020-09-15 DEATH — deceased
